# Patient Record
Sex: Female | Born: 1970
Health system: Southern US, Community
[De-identification: ages and names within clinical notes are randomized; demographics above are authoritative.]

## PROBLEM LIST (undated history)

## (undated) DIAGNOSIS — D649 Anemia, unspecified: Secondary | ICD-10-CM

## (undated) DIAGNOSIS — G43909 Migraine, unspecified, not intractable, without status migrainosus: Secondary | ICD-10-CM

## (undated) DIAGNOSIS — K219 Gastro-esophageal reflux disease without esophagitis: Secondary | ICD-10-CM

## (undated) DIAGNOSIS — K5900 Constipation, unspecified: Secondary | ICD-10-CM

## (undated) HISTORY — DX: Gastro-esophageal reflux disease without esophagitis: K21.9

## (undated) HISTORY — DX: Migraine, unspecified, not intractable, without status migrainosus: G43.909

## (undated) HISTORY — DX: Constipation, unspecified: K59.00

## (undated) HISTORY — DX: Anemia, unspecified: D64.9

## (undated) HISTORY — PX: TUBAL LIGATION: SHX77

---

## 2003-07-03 ENCOUNTER — Inpatient Hospital Stay (HOSPITAL_COMMUNITY): Admission: AD | Admit: 2003-07-03 | Discharge: 2003-07-04 | Payer: Self-pay | Admitting: Obstetrics and Gynecology

## 2003-07-25 ENCOUNTER — Other Ambulatory Visit: Admission: RE | Admit: 2003-07-25 | Discharge: 2003-07-25 | Payer: Self-pay | Admitting: Obstetrics and Gynecology

## 2003-07-30 ENCOUNTER — Inpatient Hospital Stay (HOSPITAL_COMMUNITY): Admission: AD | Admit: 2003-07-30 | Discharge: 2003-08-06 | Payer: Self-pay | Admitting: Obstetrics and Gynecology

## 2003-08-09 ENCOUNTER — Inpatient Hospital Stay (HOSPITAL_COMMUNITY): Admission: AD | Admit: 2003-08-09 | Discharge: 2003-08-10 | Payer: Self-pay | Admitting: *Deleted

## 2004-02-15 ENCOUNTER — Inpatient Hospital Stay (HOSPITAL_COMMUNITY): Admission: AD | Admit: 2004-02-15 | Discharge: 2004-02-18 | Payer: Self-pay | Admitting: Obstetrics and Gynecology

## 2004-02-19 ENCOUNTER — Encounter: Admission: RE | Admit: 2004-02-19 | Discharge: 2004-03-20 | Payer: Self-pay | Admitting: Obstetrics and Gynecology

## 2004-03-08 ENCOUNTER — Inpatient Hospital Stay (HOSPITAL_COMMUNITY): Admission: AD | Admit: 2004-03-08 | Discharge: 2004-03-08 | Payer: Self-pay | Admitting: Obstetrics and Gynecology

## 2004-03-26 ENCOUNTER — Other Ambulatory Visit: Admission: RE | Admit: 2004-03-26 | Discharge: 2004-03-26 | Payer: Self-pay | Admitting: Obstetrics and Gynecology

## 2009-05-23 ENCOUNTER — Encounter: Admission: RE | Admit: 2009-05-23 | Discharge: 2009-05-23 | Payer: Self-pay | Admitting: Family Medicine

## 2009-11-03 LAB — HM PAP SMEAR

## 2010-02-15 ENCOUNTER — Ambulatory Visit: Payer: Self-pay | Admitting: Obstetrics and Gynecology

## 2010-02-15 ENCOUNTER — Observation Stay (HOSPITAL_COMMUNITY): Admission: AD | Admit: 2010-02-15 | Discharge: 2010-02-16 | Payer: Self-pay | Admitting: Obstetrics and Gynecology

## 2010-02-20 ENCOUNTER — Ambulatory Visit: Payer: Self-pay | Admitting: Nurse Practitioner

## 2010-02-20 ENCOUNTER — Observation Stay (HOSPITAL_COMMUNITY): Admission: AD | Admit: 2010-02-20 | Discharge: 2010-02-23 | Payer: Self-pay | Admitting: Obstetrics and Gynecology

## 2010-09-24 ENCOUNTER — Inpatient Hospital Stay (HOSPITAL_COMMUNITY): Payer: BC Managed Care – PPO

## 2010-09-24 ENCOUNTER — Encounter (HOSPITAL_COMMUNITY): Payer: Self-pay | Admitting: Obstetrics and Gynecology

## 2010-09-24 ENCOUNTER — Inpatient Hospital Stay (HOSPITAL_COMMUNITY)
Admission: AD | Admit: 2010-09-24 | Discharge: 2010-09-27 | DRG: 373 | Disposition: A | Discharge status: Home / Self Care | Payer: BC Managed Care – PPO | Source: Ambulatory Visit | Attending: Obstetrics and Gynecology | Admitting: Obstetrics and Gynecology

## 2010-09-24 DIAGNOSIS — O9903 Anemia complicating the puerperium: Secondary | ICD-10-CM | POA: Diagnosis not present

## 2010-09-24 DIAGNOSIS — O09529 Supervision of elderly multigravida, unspecified trimester: Secondary | ICD-10-CM | POA: Diagnosis present

## 2010-09-24 DIAGNOSIS — R030 Elevated blood-pressure reading, without diagnosis of hypertension: Secondary | ICD-10-CM | POA: Diagnosis present

## 2010-09-24 DIAGNOSIS — D649 Anemia, unspecified: Secondary | ICD-10-CM | POA: Diagnosis not present

## 2010-09-24 DIAGNOSIS — O99892 Other specified diseases and conditions complicating childbirth: Secondary | ICD-10-CM | POA: Diagnosis present

## 2010-09-24 LAB — COMPREHENSIVE METABOLIC PANEL
Albumin: 2.9 g/dL — ABNORMAL LOW (ref 3.5–5.2)
Alkaline Phosphatase: 151 U/L — ABNORMAL HIGH (ref 39–117)
CO2: 20 mEq/L (ref 19–32)
Calcium: 8.9 mg/dL (ref 8.4–10.5)
Creatinine, Ser: 0.55 mg/dL (ref 0.4–1.2)
GFR calc Af Amer: >60 mL/min (ref >60)
Glucose, Bld: 101 mg/dL — ABNORMAL HIGH (ref 70–99)
Potassium: 4 mEq/L (ref 3.5–5.1)
Total Bilirubin: 0.2 mg/dL — ABNORMAL LOW (ref 0.3–1.2)

## 2010-09-24 LAB — CBC
HCT: 32.3 % — ABNORMAL LOW (ref 36.0–46.0)
MCHC: 31.6 g/dL (ref 30.0–36.0)
MCV: 88.3 fL (ref 78.0–100.0)
RBC: 3.66 MIL/uL — ABNORMAL LOW (ref 3.87–5.11)
RDW: 14.1 % (ref 11.5–15.5)

## 2010-09-25 LAB — RPR: RPR Ser Ql: NONREACTIVE

## 2010-09-26 LAB — CBC
HCT: 24.3 % — ABNORMAL LOW (ref 36.0–46.0)
Hemoglobin: 7.8 g/dL — ABNORMAL LOW (ref 12.0–15.0)
Platelets: 126 10*3/uL — ABNORMAL LOW (ref 150–400)

## 2010-09-27 LAB — CBC
HCT: 23.9 % — ABNORMAL LOW (ref 36.0–46.0)
Hemoglobin: 7.5 g/dL — ABNORMAL LOW (ref 12.0–15.0)
MCH: 27.9 pg (ref 26.0–34.0)
MCHC: 31.4 g/dL (ref 30.0–36.0)
Platelets: 131 10*3/uL — ABNORMAL LOW (ref 150–400)
RBC: 2.69 MIL/uL — ABNORMAL LOW (ref 3.87–5.11)
RDW: 14.5 % (ref 11.5–15.5)
WBC: 6.5 10*3/uL (ref 4.0–10.5)

## 2010-11-08 LAB — COMPREHENSIVE METABOLIC PANEL
AST: 22 U/L (ref 0–37)
Alkaline Phosphatase: 39 U/L (ref 39–117)
BUN: 6 mg/dL (ref 6–23)
Calcium: 9 mg/dL (ref 8.4–10.5)
Chloride: 107 mEq/L (ref 96–112)
Sodium: 135 mEq/L (ref 135–145)
Total Bilirubin: 0.7 mg/dL (ref 0.3–1.2)

## 2010-11-08 LAB — URINALYSIS, ROUTINE W REFLEX MICROSCOPIC
Bilirubin Urine: NEGATIVE
Glucose, UA: NEGATIVE mg/dL
Glucose, UA: NEGATIVE mg/dL
Hgb urine dipstick: NEGATIVE
Ketones, ur: >80 mg/dL — AB
Nitrite: NEGATIVE
Urobilinogen, UA: 1 mg/dL (ref 0.0–1.0)
pH: 6.5 (ref 5.0–8.0)

## 2010-11-08 LAB — CBC
HCT: 40.9 % (ref 36.0–46.0)
MCH: 33.5 pg (ref 26.0–34.0)
MCHC: 34.5 g/dL (ref 30.0–36.0)
MCHC: 35 g/dL (ref 30.0–36.0)
MCV: 95.7 fL (ref 78.0–100.0)
RBC: 4.27 MIL/uL (ref 3.87–5.11)
RBC: 3.75 MIL/uL — ABNORMAL LOW (ref 3.87–5.11)
RDW: 12.3 % (ref 11.5–15.5)
WBC: 8.9 10*3/uL (ref 4.0–10.5)
WBC: 9.2 10*3/uL (ref 4.0–10.5)

## 2010-11-08 LAB — GLUCOSE, CAPILLARY
Glucose-Capillary: 110 mg/dL — ABNORMAL HIGH (ref 70–99)
Glucose-Capillary: 136 mg/dL — ABNORMAL HIGH (ref 70–99)
Glucose-Capillary: 159 mg/dL — ABNORMAL HIGH (ref 70–99)

## 2010-11-08 LAB — BASIC METABOLIC PANEL
GFR calc non Af Amer: >60 mL/min (ref >60)
Potassium: 3.2 mEq/L — ABNORMAL LOW (ref 3.5–5.1)
Sodium: 133 mEq/L — ABNORMAL LOW (ref 135–145)

## 2011-01-08 ENCOUNTER — Other Ambulatory Visit: Payer: Self-pay | Admitting: Obstetrics and Gynecology

## 2011-01-08 ENCOUNTER — Encounter (HOSPITAL_COMMUNITY): Payer: BC Managed Care – PPO

## 2011-01-08 ENCOUNTER — Other Ambulatory Visit (HOSPITAL_COMMUNITY): Payer: BC Managed Care – PPO

## 2011-01-08 LAB — CBC
HCT: 37.9 % (ref 36.0–46.0)
Platelets: 191 10*3/uL (ref 150–400)
RDW: 12.7 % (ref 11.5–15.5)

## 2011-01-08 NOTE — Discharge Summary (Signed)
[name], [name]:  [id number]   MEDICAL RECORD NO.:  [id number]                   PATIENT TYPE:  INP   LOCATION:  9310                                 [facility]ILITY:  [facility]   PHYSICIAN:  Tracie Harrier, M.D.              DATE OF BIRTH:  24-Nov-1970   DATE OF ADMISSION:  07/29/2003  DATE OF DISCHARGE:  08/06/2003                                 DISCHARGE SUMMARY   ADMITTING DIAGNOSES:  1. Intrauterine pregnancy at 11 weeks estimated gestational age.  2. Hyperemesis gravidarum.   DISCHARGE DIAGNOSES:  1. Intrauterine pregnancy at 12 weeks estimated gestational age.  2. Hyperemesis gravidarum managed by Zofran pump.   REASON FOR ADMISSION:  Please see written H&P.   HOSPITAL COURSE:  The patient was a 40 year old Asian female primigravida  that was admitted to [facility] for management of nausea  and vomiting.  The patient had experienced nausea and vomiting for three  days prior to admission.  She had tried oral Zofran without relief.  The  patient was admitted for hydration.  CBC and thyroid function tests were  drawn.  The patient continued to experience nausea and vomiting despite  Tigan suppositories, Medrol dose pack, and Reglan.  The patient was started  on Zofran subcutaneous pump with some improvement.  Due to excessive nausea  and vomiting, potassium levels were down to 2.8.  IV runs of potassium were  given.  Thyroid panel revealed a low TSH of 0.155 with normal free T4.  Endocrine consultation was made and thought was that low TSH was a result of  the acute illness.  The patient continued with nausea, but was able to  tolerate some oral fluids.  Abdomen was soft and uterus was nontender.  The  patient was noted to have history of bulimia.  Consultation was made with  social worker and nutritionalist.  The patient desired discharge.  40 mEq of  K-Dur was given orally prior to discharge and patient was discharged home.   CONDITION ON DISCHARGE:  Stable.   DIET:  Increase fluids as tolerated.  Encouraged protein shakes as  tolerated.   ACTIVITY:  No limitations were given.   FOLLOWUP:  The patient is to follow up in the office in two to three days  for weight check.  She is to call for increase in nausea and vomiting or  dizziness.  She is also to call for vaginal bleeding or increase in vaginal  pressure.   DISCHARGE MEDICATIONS:  Zofran pump managed by [facility].     Julio Sicks, N.P.                        Tracie Harrier, M.D.    CC/MEDQ  D:  08/29/2003  T:  08/29/2003  Job:  [id number]

## 2011-01-08 NOTE — Consult Note (Signed)
NAMEPERLE, BRICKHOUSE NO.:  [id number]   MEDICAL RECORD NO.:  [id number]                   PATIENT TYPE:  INP   LOCATION:  9310                                 [facility]   PHYSICIAN:  Reather Littler, M.D.                    DATE OF BIRTH:  1971-08-17   DATE OF CONSULTATION:  08/05/2003  DATE OF DISCHARGE:                                   CONSULTATION   REASON FOR CONSULTATION:  Thyroid evaluation.   HISTORY:  The patient is a 40 year old primigravida who has been admitted  since July 30, 2003 for hyperemesis and dehydration.  As part of her  routine admission she was found to have a slightly low TSH level.  The  patient, however, has no prior history of thyroid disease.   On questioning, the patient says that she has not had any unusual weight  loss.  She did lose some weight on admission but this was related to her  vomiting and dehydration.  The patient has no history of heat intolerance or  sweating.  She has no history of palpitations or nervousness.  There is no  history of diarrhea, insomnia, or skin rash or itching.  She has not had any  hair or skin changes.  The patient is currently in first trimester of  pregnancy.   CURRENT MEDICATIONS:  Medrol Dosepak, Protonix, Zofran, Tigan, and p.r.n.  medications.   ALLERGIES:  None.   PAST HISTORY:  No significant illness.   FAMILY HISTORY:  No history of thyroid disease or diabetes.   REVIEW OF SYSTEMS:  The patient has no history of diabetes, hypertension, or  GI problems previously.  She has not had any muscle weakness.  She does  complain of some dizziness today with the nausea.   PHYSICAL EXAMINATION:  GENERAL:  The patient is alert and cooperative.  She  is in some distress from her nausea.  VITAL SIGNS:  Her blood pressure is 119/80, pulse is 88 and regular.  HEENT:  There is no pallor.  Eyes externally are normal.  ENT exam normal.  NECK:  There is no thyroid enlargement or  lymphadenopathy.  HEART:  Heart sounds are normal.  LUNGS:  Clear.  ABDOMEN:  No tenderness.  EXTREMITIES:  Normal reflexes or only slightly brisk.  There is no tremor  present.  Hands are not unusually warm or sweaty.   ASSESSMENT:  Since the patient's TSH is not in the hyperthyroid range it may  be simply partially suppressed from acute illness and the euthyroid sick  syndrome.  Free T4 is currently quite normal and therefore I do not suspect  hyperthyroidism.   RECOMMENDATIONS:  The patient should have free T3 level and also repeat TSH  done.   This will be followed up as an outpatient and the patient can be seen in the  office as  needed.  The patient's hypokalemia needs to be treated  intravenously to ensure adequate replacement and repeat levels done in the  morning.   Thank you for the consultation.                                               Reather Littler, M.D.    AK/MEDQ  D:  08/05/2003  T:  08/05/2003  Job:  [id number]   cc:   Dineen Kid. Rana Snare, M.D.  [address]  [place]  [place] [address]  Fax: [phone]

## 2011-01-11 ENCOUNTER — Ambulatory Visit (HOSPITAL_COMMUNITY)
Admission: RE | Admit: 2011-01-11 | Discharge: 2011-01-11 | Disposition: A | Discharge status: Home / Self Care | Payer: BC Managed Care – PPO | Source: Ambulatory Visit | Attending: Obstetrics and Gynecology | Admitting: Obstetrics and Gynecology

## 2011-01-11 DIAGNOSIS — Z01818 Encounter for other preprocedural examination: Secondary | ICD-10-CM | POA: Insufficient documentation

## 2011-01-11 DIAGNOSIS — Z01812 Encounter for preprocedural laboratory examination: Secondary | ICD-10-CM | POA: Insufficient documentation

## 2011-01-11 DIAGNOSIS — Z302 Encounter for sterilization: Secondary | ICD-10-CM | POA: Insufficient documentation

## 2011-01-15 NOTE — Op Note (Signed)
  NAMEKALA, AMBRIZ NO.:  [id number]  MEDICAL RECORD NO.:  [id number]           PATIENT TYPE:  O  LOCATION:  [facility]                          [facility]  PHYSICIAN:  Dalton Mille L. Tamyah Cutbirth, M.D.DATE OF BIRTH:  28-Jul-1971  DATE OF PROCEDURE:  01/11/2011 DATE OF DISCHARGE:                              OPERATIVE REPORT   PREOPERATIVE DIAGNOSIS:  Desires permanent sterilization.  POSTOPERATIVE DIAGNOSIS:  Desires permanent sterilization.  PROCEDURES: 1. Open laparoscopy. 2. Bilateral tubal ligation with fulguration.  SURGEON:  Jaymond Waage L. Vincente Poli, MD  ANESTHESIA:  General.  DESCRIPTION OF PROCEDURE:  The patient was taken to the operating room. She was intubated.  She was then prepped and draped.  In-and-out catheter was used to empty the bladder.  A uterine manipulator was inserted.  Attention was then turned to the abdomen where a small infraumbilical was made with the scalpel.  We then inserted the Veress needle without any difficulty and performed pneumoperitoneum.  I then inserted the 12-mm trocar but I could tell when I put the trocar in that I was preperitoneal.  At this point, I decided to do an open laparoscopy and we easily went in and grasped the fascia using Allis clamps, made a small incision in the fascia and then inserted the Hasson trocar and then inserted the scope.  Pneumoperitoneum was performed quite easily. The scope was inserted.  Looking around, I could see that there was no bowel injury or no bleeding noted anywhere.  I then placed the patient in Trendelenburg position.  We were able to easily examine and visualize the uterus which appeared normal, ovaries appeared normal, as well as the fallopian tube.  Using a long Kleppinger, I then placed across the midportion of each fallopian tube and performed a bilateral tubal ligation with the triple-burn technique, making sure the wattage went down to 0 at each time.  Photographs were taken.   No bleeding was noted. The Hasson was removed after pneumoperitoneum was released.  A stitch was placed in the fascia with 0 Vicryl suture.  The skin was closed with Dermabond.  All sponge, lap, and instrument counts were correct x2.  The patient went to recovery room in good condition.     Paysley Poplar L. Vincente Poli, M.D.     Florestine Avers  D:  01/11/2011  T:  01/12/2011  Job:  [id number]  Electronically Signed by Marcelle Overlie M.D. on 01/15/2011 07:19:25 AM

## 2012-11-15 ENCOUNTER — Encounter: Payer: BC Managed Care – PPO | Admitting: Family Medicine

## 2012-11-22 ENCOUNTER — Encounter: Payer: BC Managed Care – PPO | Admitting: Family Medicine

## 2012-11-23 ENCOUNTER — Other Ambulatory Visit (HOSPITAL_COMMUNITY)
Admission: RE | Admit: 2012-11-23 | Discharge: 2012-11-23 | Disposition: A | Discharge status: Home / Self Care | Payer: BC Managed Care – PPO | Source: Ambulatory Visit | Attending: Family Medicine | Admitting: Family Medicine

## 2012-11-23 ENCOUNTER — Encounter: Payer: Self-pay | Admitting: Family Medicine

## 2012-11-23 ENCOUNTER — Ambulatory Visit (INDEPENDENT_AMBULATORY_CARE_PROVIDER_SITE_OTHER): Payer: BC Managed Care – PPO | Admitting: Family Medicine

## 2012-11-23 VITALS — BP 113/80 | HR 70

## 2012-11-23 DIAGNOSIS — Z1151 Encounter for screening for human papillomavirus (HPV): Secondary | ICD-10-CM | POA: Insufficient documentation

## 2012-11-23 DIAGNOSIS — Z0000 Encounter for general adult medical examination without abnormal findings: Secondary | ICD-10-CM

## 2012-11-23 DIAGNOSIS — Z01419 Encounter for gynecological examination (general) (routine) without abnormal findings: Secondary | ICD-10-CM | POA: Insufficient documentation

## 2012-11-23 DIAGNOSIS — K219 Gastro-esophageal reflux disease without esophagitis: Secondary | ICD-10-CM

## 2012-11-23 DIAGNOSIS — Z124 Encounter for screening for malignant neoplasm of cervix: Secondary | ICD-10-CM

## 2012-11-23 LAB — POCT URINALYSIS DIPSTICK
Bilirubin, UA: NEGATIVE
Blood, UA: NEGATIVE
Glucose, UA: NEGATIVE
Ketones, UA: NEGATIVE
Leukocytes, UA: NEGATIVE
Nitrite, UA: NEGATIVE
Protein, UA: NEGATIVE
Spec Grav, UA: 1.01
Urobilinogen, UA: NEGATIVE
pH, UA: 6

## 2012-11-23 MED ORDER — PANTOPRAZOLE SODIUM 40 MG PO TBEC: 40.0000 mg | DELAYED_RELEASE_TABLET | Freq: Every day | ORAL | Status: DC

## 2012-11-23 NOTE — Progress Notes (Signed)
Subjective:     Patient ID: Katelyn Jenkins, female   DOB: [birth year], 42 y.o.   MRN: [id number]  HPI Katelyn Jenkins is here today for her annual CPE with pap.  She has done well since her last office visit. She had a little girl   Her last LMP was on 11/11/12.   Review of Systems  Constitutional: Negative for activity change, fatigue and unexpected weight change.  HENT: Negative for hearing loss, rhinorrhea, trouble swallowing, neck pain and neck stiffness.   Eyes: Negative for visual disturbance.  Respiratory: Negative for chest tightness, shortness of breath and wheezing.   Cardiovascular: Negative for chest pain and palpitations.  Gastrointestinal: Positive for abdominal pain. Negative for diarrhea and constipation.  Genitourinary: Negative for frequency, vaginal discharge, difficulty urinating and pelvic pain.  Neurological: Negative for dizziness, tremors and weakness.  Psychiatric/Behavioral: Negative for sleep disturbance.       Objective:   Physical Exam  Constitutional: She is oriented to person, place, and time. She appears well-developed and well-nourished.  HENT:  Head: Normocephalic and atraumatic.  Right Ear: External ear normal.  Left Ear: External ear normal.  Nose: Nose normal.  Mouth/Throat: Oropharynx is clear and moist.  Eyes: Conjunctivae and EOM are normal. Pupils are equal, round, and reactive to light.  Neck: Normal range of motion. No thyromegaly present.  Cardiovascular: Normal rate, regular rhythm, normal heart sounds and intact distal pulses.  Exam reveals no gallop and no friction rub.   No murmur heard. Pulmonary/Chest: Effort normal and breath sounds normal.  Abdominal: Soft. Bowel sounds are normal.  Genitourinary: Rectum normal, vagina normal and uterus normal. No breast swelling, tenderness or discharge. Pelvic exam was performed with patient supine. Cervix exhibits no motion tenderness, no discharge and no friability. Right adnexum displays no mass, no  tenderness and no fullness. Left adnexum displays no mass, no tenderness and no fullness. No tenderness around the vagina. No vaginal discharge found.  Musculoskeletal: Normal range of motion. She exhibits no edema and no tenderness.  Lymphadenopathy:    She has no cervical adenopathy.  Neurological: She is alert and oriented to person, place, and time. She has normal reflexes.  Skin: Skin is warm and dry.  Psychiatric: She has a normal mood and affect. Her behavior is normal. Judgment and thought content normal.       Assessment:     CPE  GERD    Plan:     She was given a prescription for Protonix.

## 2012-11-23 NOTE — Patient Instructions (Addendum)
1)  Stomach Pain - Acid Reflux vs Gallstones   A)  Acid Reflux - Try taking Ranitidine 300 mg (2 of the OTC 150 mg) plus Tums plus 1 teaspoon of baking soda in 8 oz water. If this doesn't work then try Nexium.    B)  Gallstones - If you worsen, we could check an abdominal U/S.     2)  Mammogram  Cholelithiasis Cholelithiasis (also called gallstones) is a form of gallbladder disease where gallstones form in your gallbladder. The gallbladder is a non-essential organ that stores bile made in the liver, which helps digest fats. Gallstones begin as small crystals and slowly grow into stones. Gallstone pain occurs when the gallbladder spasms, and a gallstone is blocking the duct. Pain can also occur when a stone passes out of the duct.  Women are more likely to develop gallstones than men. Other factors that increase the risk of gallbladder disease are:  Having multiple pregnancies. Physicians sometimes advise removing diseased gallbladders before future pregnancies.  Obesity.  Diets heavy in fried foods and fat.  Increasing age (older than 47).  Prolonged use of medications containing female hormones.  Diabetes mellitus.  Rapid weight loss.  Family history of gallstones (heredity). SYMPTOMS  Feeling sick to your stomach (nauseous).  Abdominal pain.  Yellowing of the skin (jaundice).  Sudden pain. It may persist from several minutes to several hours.  Worsening pain with deep breathing or when jarred.  Fever.  Tenderness to the touch. In some cases, when gallstones do not move into the bile duct, people have no pain or symptoms. These are called "silent" gallstones. TREATMENT In severe cases, emergency surgery may be required. HOME CARE INSTRUCTIONS   Only take over-the-counter or prescription medicines for pain, discomfort, or fever as directed by your caregiver.  Follow a low-fat diet until seen again. Fat causes the gallbladder to contract, which can result in  pain.  Follow up as instructed. Attacks are almost always recurrent and surgery is usually required for permanent treatment. SEEK IMMEDIATE MEDICAL CARE IF:   Your pain increases and is not controlled by medications.  You have an oral temperature above 102 F (38.9 C), not controlled by medication.  You develop nausea and vomiting. MAKE SURE YOU:   Understand these instructions.  Will watch your condition.  Will get help right away if you are not doing well or get worse. Document Released: 08/05/2005 Document Revised: 11/01/2011 Document Reviewed: 10/08/2010 [facility] Patient Information 2013 [place], [place]. Gastroesophageal Reflux Disease, Adult Gastroesophageal reflux disease (GERD) happens when acid from your stomach flows up into the esophagus. When acid comes in contact with the esophagus, the acid causes soreness (inflammation) in the esophagus. Over time, GERD may create small holes (ulcers) in the lining of the esophagus. CAUSES   Increased body weight. This puts pressure on the stomach, making acid rise from the stomach into the esophagus.  Smoking. This increases acid production in the stomach.  Drinking alcohol. This causes decreased pressure in the lower esophageal sphincter (valve or ring of muscle between the esophagus and stomach), allowing acid from the stomach into the esophagus.  Late evening meals and a full stomach. This increases pressure and acid production in the stomach.  A malformed lower esophageal sphincter. Sometimes, no cause is found. SYMPTOMS   Burning pain in the lower part of the mid-chest behind the breastbone and in the mid-stomach area. This may occur twice a week or more often.  Trouble swallowing.  Sore throat.  Dry  cough.  Asthma-like symptoms including chest tightness, shortness of breath, or wheezing. DIAGNOSIS  Your caregiver may be able to diagnose GERD based on your symptoms. In some cases, X-rays and other tests may be done to  check for complications or to check the condition of your stomach and esophagus. TREATMENT  Your caregiver may recommend over-the-counter or prescription medicines to help decrease acid production. Ask your caregiver before starting or adding any new medicines.  HOME CARE INSTRUCTIONS   Change the factors that you can control. Ask your caregiver for guidance concerning weight loss, quitting smoking, and alcohol consumption.  Avoid foods and drinks that make your symptoms worse, such as:  Caffeine or alcoholic drinks.  Chocolate.  Peppermint or mint flavorings.  Garlic and onions.  Spicy foods.  Citrus fruits, such as oranges, lemons, or limes.  Tomato-based foods such as sauce, chili, salsa, and pizza.  Fried and fatty foods.  Avoid lying down for the 3 hours prior to your bedtime or prior to taking a nap.  Eat small, frequent meals instead of large meals.  Wear loose-fitting clothing. Do not wear anything tight around your waist that causes pressure on your stomach.  Raise the head of your bed 6 to 8 inches with wood blocks to help you sleep. Extra pillows will not help.  Only take over-the-counter or prescription medicines for pain, discomfort, or fever as directed by your caregiver.  Do not take aspirin, ibuprofen, or other nonsteroidal anti-inflammatory drugs (NSAIDs). SEEK IMMEDIATE MEDICAL CARE IF:   You have pain in your arms, neck, jaw, teeth, or back.  Your pain increases or changes in intensity or duration.  You develop nausea, vomiting, or sweating (diaphoresis).  You develop shortness of breath, or you faint.  Your vomit is green, yellow, black, or looks like coffee grounds or blood.  Your stool is red, bloody, or black. These symptoms could be signs of other problems, such as heart disease, gastric bleeding, or esophageal bleeding. MAKE SURE YOU:   Understand these instructions.  Will watch your condition.  Will get help right away if you are not  doing well or get worse. Document Released: 05/19/2005 Document Revised: 11/01/2011 Document Reviewed: 02/26/2011 [facility] Patient Information 2013 [place], [place].

## 2012-11-26 ENCOUNTER — Encounter: Payer: Self-pay | Admitting: Family Medicine

## 2012-11-26 DIAGNOSIS — K219 Gastro-esophageal reflux disease without esophagitis: Secondary | ICD-10-CM | POA: Insufficient documentation

## 2013-04-20 ENCOUNTER — Other Ambulatory Visit: Payer: Self-pay | Admitting: Family Medicine

## 2013-04-24 ENCOUNTER — Other Ambulatory Visit: Payer: Self-pay | Admitting: Family Medicine

## 2013-04-24 DIAGNOSIS — Z1231 Encounter for screening mammogram for malignant neoplasm of breast: Secondary | ICD-10-CM

## 2013-05-16 ENCOUNTER — Ambulatory Visit
Admission: RE | Admit: 2013-05-16 | Discharge: 2013-05-16 | Disposition: A | Discharge status: Home / Self Care | Payer: Self-pay | Source: Ambulatory Visit | Attending: Family Medicine | Admitting: Family Medicine

## 2013-05-16 DIAGNOSIS — Z1231 Encounter for screening mammogram for malignant neoplasm of breast: Secondary | ICD-10-CM

## 2014-06-24 ENCOUNTER — Encounter: Payer: Self-pay | Admitting: Family Medicine

## 2014-10-28 ENCOUNTER — Other Ambulatory Visit: Payer: Self-pay

## 2014-10-28 DIAGNOSIS — Z1231 Encounter for screening mammogram for malignant neoplasm of breast: Secondary | ICD-10-CM

## 2014-10-30 ENCOUNTER — Ambulatory Visit
Admission: RE | Admit: 2014-10-30 | Discharge: 2014-10-30 | Disposition: A | Discharge status: Home / Self Care | Payer: BLUE CROSS/BLUE SHIELD | Source: Ambulatory Visit

## 2014-10-30 DIAGNOSIS — Z1231 Encounter for screening mammogram for malignant neoplasm of breast: Secondary | ICD-10-CM

## 2015-07-27 ENCOUNTER — Emergency Department (HOSPITAL_COMMUNITY)
Admission: EM | Admit: 2015-07-27 | Discharge: 2015-07-27 | Disposition: A | Discharge status: Home / Self Care | Payer: BLUE CROSS/BLUE SHIELD | Attending: Emergency Medicine | Admitting: Emergency Medicine

## 2015-07-27 ENCOUNTER — Emergency Department (HOSPITAL_COMMUNITY): Payer: BLUE CROSS/BLUE SHIELD

## 2015-07-27 ENCOUNTER — Encounter (HOSPITAL_COMMUNITY): Payer: Self-pay | Admitting: Emergency Medicine

## 2015-07-27 DIAGNOSIS — S3991XA Unspecified injury of abdomen, initial encounter: Secondary | ICD-10-CM | POA: Diagnosis present

## 2015-07-27 DIAGNOSIS — Y9389 Activity, other specified: Secondary | ICD-10-CM | POA: Insufficient documentation

## 2015-07-27 DIAGNOSIS — Z8719 Personal history of other diseases of the digestive system: Secondary | ICD-10-CM | POA: Diagnosis not present

## 2015-07-27 DIAGNOSIS — Y998 Other external cause status: Secondary | ICD-10-CM | POA: Insufficient documentation

## 2015-07-27 DIAGNOSIS — Z8679 Personal history of other diseases of the circulatory system: Secondary | ICD-10-CM | POA: Diagnosis not present

## 2015-07-27 DIAGNOSIS — Z9851 Tubal ligation status: Secondary | ICD-10-CM | POA: Insufficient documentation

## 2015-07-27 DIAGNOSIS — Z862 Personal history of diseases of the blood and blood-forming organs and certain disorders involving the immune mechanism: Secondary | ICD-10-CM | POA: Diagnosis not present

## 2015-07-27 DIAGNOSIS — Y92410 Unspecified street and highway as the place of occurrence of the external cause: Secondary | ICD-10-CM | POA: Insufficient documentation

## 2015-07-27 DIAGNOSIS — R1012 Left upper quadrant pain: Secondary | ICD-10-CM

## 2015-07-27 LAB — CBC
HCT: 35.4 % — ABNORMAL LOW (ref 36.0–46.0)
HEMOGLOBIN: 12.1 g/dL (ref 12.0–15.0)
MCH: 30.9 pg (ref 26.0–34.0)
MCHC: 34.2 g/dL (ref 30.0–36.0)
MCV: 90.5 fL (ref 78.0–100.0)
PLATELETS: 196 10*3/uL (ref 150–400)
RBC: 3.91 MIL/uL (ref 3.87–5.11)
RDW: 12.1 % (ref 11.5–15.5)
WBC: 4.5 10*3/uL (ref 4.0–10.5)

## 2015-07-27 LAB — COMPREHENSIVE METABOLIC PANEL
ALBUMIN: 4.4 g/dL (ref 3.5–5.0)
ALK PHOS: 52 U/L (ref 38–126)
ALT: 12 U/L — AB (ref 14–54)
ANION GAP: 7 (ref 5–15)
AST: 24 U/L (ref 15–41)
BILIRUBIN TOTAL: 0.8 mg/dL (ref 0.3–1.2)
BUN: 14 mg/dL (ref 6–20)
CALCIUM: 9.1 mg/dL (ref 8.9–10.3)
CO2: 24 mmol/L (ref 22–32)
CREATININE: 0.53 mg/dL (ref 0.44–1.00)
Chloride: 107 mmol/L (ref 101–111)
GFR calc Af Amer: >60 mL/min (ref >60)
GFR calc non Af Amer: >60 mL/min (ref >60)
GLUCOSE: 105 mg/dL — AB (ref 65–99)
Potassium: 3.6 mmol/L (ref 3.5–5.1)
Sodium: 138 mmol/L (ref 135–145)
TOTAL PROTEIN: 7 g/dL (ref 6.5–8.1)

## 2015-07-27 LAB — LIPASE, BLOOD: LIPASE: 31 U/L (ref 11–51)

## 2015-07-27 LAB — I-STAT BETA HCG BLOOD, ED (MC, WL, AP ONLY)

## 2015-07-27 MED ORDER — IOHEXOL 300 MG/ML  SOLN
100.0000 mL | Freq: Once | INTRAMUSCULAR | Status: AC | PRN
  Administered 2015-07-27: 100 mL via INTRAVENOUS
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
## 2015-07-27 NOTE — ED Notes (Signed)
Patient transported to CT 
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
## 2015-07-27 NOTE — ED Notes (Signed)
2 attempts at IV start

## 2015-10-08 LAB — BASIC METABOLIC PANEL
BUN: 17 mg/dL (ref 4–21)
Creatinine: 0.6 mg/dL (ref 0.5–1.1)
GLUCOSE: 89 mg/dL
POTASSIUM: 4.2 mmol/L (ref 3.4–5.3)
SODIUM: 139 mmol/L (ref 137–147)

## 2015-10-08 LAB — LIPID PANEL
Cholesterol: 174 mg/dL (ref 0–200)
HDL: 45 mg/dL (ref 35–70)
LDL CALC: 92 mg/dL
Triglycerides: 183 mg/dL — AB (ref 40–160)

## 2015-10-08 LAB — TSH: TSH: 0.54 u[IU]/mL (ref 0.41–5.90)

## 2015-10-10 ENCOUNTER — Other Ambulatory Visit: Payer: Self-pay | Admitting: Family Medicine

## 2015-10-10 DIAGNOSIS — Z1231 Encounter for screening mammogram for malignant neoplasm of breast: Secondary | ICD-10-CM

## 2015-10-31 ENCOUNTER — Ambulatory Visit
Admission: RE | Admit: 2015-10-31 | Discharge: 2015-10-31 | Disposition: A | Discharge status: Home / Self Care | Payer: BLUE CROSS/BLUE SHIELD | Source: Ambulatory Visit | Attending: Family Medicine | Admitting: Family Medicine

## 2015-10-31 DIAGNOSIS — Z1231 Encounter for screening mammogram for malignant neoplasm of breast: Secondary | ICD-10-CM

## 2015-11-03 ENCOUNTER — Other Ambulatory Visit: Payer: Self-pay | Admitting: Family Medicine

## 2015-11-03 DIAGNOSIS — R928 Other abnormal and inconclusive findings on diagnostic imaging of breast: Secondary | ICD-10-CM

## 2015-11-11 ENCOUNTER — Ambulatory Visit
Admission: RE | Admit: 2015-11-11 | Discharge: 2015-11-11 | Disposition: A | Discharge status: Home / Self Care | Payer: BLUE CROSS/BLUE SHIELD | Source: Ambulatory Visit | Attending: Family Medicine | Admitting: Family Medicine

## 2015-11-11 DIAGNOSIS — R928 Other abnormal and inconclusive findings on diagnostic imaging of breast: Secondary | ICD-10-CM

## 2016-10-15 ENCOUNTER — Other Ambulatory Visit: Payer: Self-pay | Admitting: Family Medicine

## 2016-10-15 DIAGNOSIS — Z1231 Encounter for screening mammogram for malignant neoplasm of breast: Secondary | ICD-10-CM

## 2016-11-15 ENCOUNTER — Ambulatory Visit: Payer: BLUE CROSS/BLUE SHIELD

## 2016-11-29 ENCOUNTER — Ambulatory Visit
Admission: RE | Admit: 2016-11-29 | Discharge: 2016-11-29 | Disposition: A | Discharge status: Home / Self Care | Payer: BLUE CROSS/BLUE SHIELD | Source: Ambulatory Visit | Attending: Family Medicine | Admitting: Family Medicine

## 2016-11-29 DIAGNOSIS — Z1231 Encounter for screening mammogram for malignant neoplasm of breast: Secondary | ICD-10-CM

## 2016-11-30 ENCOUNTER — Other Ambulatory Visit (HOSPITAL_COMMUNITY)
Admission: RE | Admit: 2016-11-30 | Discharge: 2016-11-30 | Disposition: A | Discharge status: Home / Self Care | Payer: BLUE CROSS/BLUE SHIELD | Source: Ambulatory Visit | Attending: Family Medicine | Admitting: Family Medicine

## 2016-11-30 ENCOUNTER — Ambulatory Visit (INDEPENDENT_AMBULATORY_CARE_PROVIDER_SITE_OTHER): Payer: BLUE CROSS/BLUE SHIELD | Admitting: Family Medicine

## 2016-11-30 ENCOUNTER — Encounter: Payer: Self-pay | Admitting: Family Medicine

## 2016-11-30 VITALS — BP 120/70 | HR 85 | Resp 12 | Ht 60.0 in | Wt 123.0 lb

## 2016-11-30 DIAGNOSIS — Z0000 Encounter for general adult medical examination without abnormal findings: Secondary | ICD-10-CM | POA: Insufficient documentation

## 2016-11-30 DIAGNOSIS — Z131 Encounter for screening for diabetes mellitus: Secondary | ICD-10-CM

## 2016-11-30 DIAGNOSIS — E785 Hyperlipidemia, unspecified: Secondary | ICD-10-CM | POA: Insufficient documentation

## 2016-11-30 DIAGNOSIS — E781 Pure hyperglyceridemia: Secondary | ICD-10-CM

## 2016-11-30 DIAGNOSIS — E559 Vitamin D deficiency, unspecified: Secondary | ICD-10-CM

## 2016-11-30 DIAGNOSIS — Z124 Encounter for screening for malignant neoplasm of cervix: Secondary | ICD-10-CM | POA: Diagnosis present

## 2016-11-30 LAB — BASIC METABOLIC PANEL
BUN: 10 mg/dL (ref 6–23)
CALCIUM: 9.5 mg/dL (ref 8.4–10.5)
CO2: 25 meq/L (ref 19–32)
Chloride: 103 mEq/L (ref 96–112)
Creatinine, Ser: 0.58 mg/dL (ref 0.40–1.20)
GFR: 119.69 mL/min (ref >60.00)
Glucose, Bld: 76 mg/dL (ref 70–99)
Potassium: 3.7 mEq/L (ref 3.5–5.1)
SODIUM: 138 meq/L (ref 135–145)

## 2016-11-30 LAB — LDL CHOLESTEROL, DIRECT: Direct LDL: 89 mg/dL

## 2016-11-30 LAB — LIPID PANEL
Cholesterol: 184 mg/dL (ref 0–200)
HDL: 47.6 mg/dL (ref >39.00)
NONHDL: 136.74
Total CHOL/HDL Ratio: 4
Triglycerides: 223 mg/dL — ABNORMAL HIGH (ref 0.0–149.0)
VLDL: 44.6 mg/dL — ABNORMAL HIGH (ref 0.0–40.0)

## 2016-11-30 LAB — VITAMIN D 25 HYDROXY (VIT D DEFICIENCY, FRACTURES): VITD: 26.92 ng/mL — ABNORMAL LOW (ref 30.00–100.00)

## 2016-11-30 NOTE — Progress Notes (Signed)
Pre visit review using our clinic review tool, if applicable. No additional management support is needed unless otherwise documented below in the visit note. 

## 2016-11-30 NOTE — Progress Notes (Signed)
HPI:   [name] is a 46 y.o. female, who is here today for her routine physical.  I saw her last in 09/2015 for CPE at [facility] Regular exercise 3 or more time per week: Not recently.  Following a healthy diet: Yes. She lives with husband,mother,and 2 children.  Chronic medical problems: Vit D deficiency,she is not on vit D supplementation.Hypertriglyceridemia, GERD.  Last FLP 09/2015 TC 174,TG 183,HDL 45,and LDL 92.  Pap smear last year and negative. Hx of abnormal pap smears: Denies. Hx of STD's Denies.  M: 46 years old. G: 2 L: 2 Birth control: BTL. LMP 11/18/16.  Mammogram: [facility] 1 She had colonoscopy due to constipation 08/2015. She has no concerns today.   Review of Systems  Constitutional: Negative for appetite change, fatigue, fever and unexpected weight change.  HENT: Negative for dental problem, hearing loss, mouth sores, nosebleeds, trouble swallowing and voice change.   Eyes: Negative for photophobia and visual disturbance.  Respiratory: Negative for cough, shortness of breath and wheezing.   Cardiovascular: Negative for chest pain and leg swelling.  Gastrointestinal: Negative for abdominal pain, blood in stool, nausea and vomiting.       No changes in bowel habits.  Endocrine: Negative for cold intolerance, heat intolerance, polydipsia, polyphagia and polyuria.  Genitourinary: Negative for decreased urine volume, dysuria, hematuria, menstrual problem, vaginal bleeding and vaginal discharge.       No breast tenderness or nipple discharge.  Musculoskeletal: Negative for gait problem and joint swelling.  Skin: Negative for rash.  Neurological: Negative for syncope, weakness, numbness and headaches.  Hematological: Negative for adenopathy. Does not bruise/bleed easily.  Psychiatric/Behavioral: Negative for confusion and sleep disturbance. The patient is not nervous/anxious.   All other systems reviewed and are negative.     No current  outpatient prescriptions on file prior to visit.   No current facility-administered medications on file prior to visit.      Past Medical History:  Diagnosis Date  . Anemia   . Constipation   . GERD (gastroesophageal reflux disease)   . Migraine headache     No Known Allergies  Family History  Problem Relation Age of Onset  . Kidney disease Father   . Hypertension Mother   . Cancer Neg Hx     Social History   Social History  . Marital status: Married    Spouse name: N/A  . Number of children: N/A  . Years of education: N/A   Social History Main Topics  . Smoking status: Never Smoker  . Smokeless tobacco: Never Used  . Alcohol use No  . Drug use: No  . Sexual activity: Yes    Birth control/ protection: Surgical   Other Topics Concern  . None   Social History Narrative   Marital Status:  Married    Children:  G1 P1001 (Son - Joelene Millin)    Pets:  None    Living Situation: Lives with husband.   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 O2 sat at RA 99%.  Wt Readings from Last 3 Encounters:  11/30/16 123 lb (55.8 kg)    Physical Exam  Nursing note and vitals reviewed. Constitutional: She is oriented to person, place, and time. She appears well-developed and well-nourished. No distress.  HENT:  Head: Atraumatic.  Right Ear: Hearing, tympanic membrane, external ear and ear canal normal.  Left Ear: Hearing, tympanic membrane, external ear and ear canal normal.  Mouth/Throat: Uvula is midline, oropharynx is clear and moist and mucous membranes are normal.  Eyes: Conjunctivae and EOM are normal. Pupils are equal, round, and reactive to light.  Neck:  No tracheal deviation present. No thyroid mass and no thyromegaly present.  Cardiovascular: Normal rate and regular rhythm.   No murmur heard. Pulses:      Dorsalis pedis pulses are 2+ on the right side, and 2+ on the left side.  Respiratory: Effort normal and breath sounds normal. No respiratory distress.  GI: Soft. She exhibits no mass. There is no hepatomegaly. There is no tenderness. Hernia confirmed negative in the right inguinal area and confirmed negative in the left inguinal area.  Genitourinary: No breast swelling or tenderness. There is no rash, tenderness or lesion on the right labia. There is no rash, tenderness or lesion on the left labia. Uterus is not enlarged and not tender. Cervix exhibits no motion tenderness, no discharge and no friability. Right adnexum displays no mass and no tenderness. Left adnexum displays no mass and no tenderness. No erythema or tenderness in the vagina. No vaginal discharge found.  Genitourinary Comments: Breast: No masses or nipple discharge. Fibrocystic changes outer upper quadrants bilateral.   Musculoskeletal: She exhibits no edema.  No major deformity or signs of synovitis appreciated.  Lymphadenopathy:    She has no cervical adenopathy.    She has no axillary adenopathy.       Right: No inguinal and no supraclavicular adenopathy present.       Left: No inguinal and no supraclavicular adenopathy present.  Neurological: She is alert and oriented to person, place, and time. She has normal strength. No cranial nerve deficit. Coordination and gait normal.  Reflex Scores:      Bicep reflexes are 2+ on the right side and 2+ on the left side.      Patellar reflexes are 2+ on the right side and 2+ on the left side. Skin: Skin is warm. No rash noted. No erythema.  Psychiatric: She has a normal mood and affect. Her speech is normal. Cognition and memory are normal.  Well groomed, good eye contact.    ASSESSMENT AND PLAN:   Katelyn Jenkins was seen today for  annual exam.  Diagnoses and all orders for this visit:  Lab Results  Component Value Date   CHOL 184 11/30/2016   HDL 47.60 11/30/2016   LDLCALC 92 10/08/2015   LDLDIRECT 89.0 11/30/2016   TRIG 223.0 (H) 11/30/2016   CHOLHDL 4 11/30/2016   Lab Results  Component Value Date   CREATININE 0.58 11/30/2016   BUN 10 11/30/2016   NA 138 11/30/2016   K 3.7 11/30/2016   CL 103 11/30/2016   CO2 25 11/30/2016    Routine general medical examination at a health care facility   We discussed the importance of regular physical activity and healthy diet for prevention of chronic illness and/or complications. Preventive guidelines reviewed. Vaccination up to date per pt request, Tdap not sure about date.  Ca++ and vit D supplementation recommended. Next CPE in 1  year.   -     Basic metabolic panel -     Lipid panel -     PAP [[facility]] -     LDL cholesterol, direct  Vitamin D deficiency  Further recommendations will be given according to lab results.  -     VITAMIN D 25 Hydroxy (Vit-D Deficiency, Fractures)  Hypertriglyceridemia  Low fat diet recommended as well as regular exercise. Further recommendations will be given according to lab results.  -     Lipid panel -     LDL cholesterol, direct  Diabetes mellitus screening -     Basic metabolic panel  Cervical cancer screening -     PAP [[place]]    Return in about 1 year (around 11/30/2017) for routine.      Katelyn G. [place], MD  [facility]. [place] office.

## 2016-11-30 NOTE — Patient Instructions (Signed)
A few things to remember from today's visit:   Routine general medical examination at a health care facility - Plan: Basic metabolic panel, Lipid panel, Cytology - PAP ([place])  Vitamin D deficiency - Plan: VITAMIN D 25 Hydroxy (Vit-D Deficiency, Fractures)    At least 150 minutes of moderate exercise per week, daily brisk walking for 15-30 min is a good exercise option. Healthy diet low in saturated (animal) fats and sweets and consisting of fresh fruits and vegetables, lean meats such as fish and white chicken and whole grains.   - Vaccines:  Tdap vaccine every 10 years.  Shingles vaccine recommended at age 32, could be given after 45 years of age but not sure about insurance coverage.  Pneumonia vaccines:  Prevnar 13 at 65 and Pneumovax at 66.  Screening recommendations for low/normal risk women:  Screening for diabetes at age 28-45 and every 3 years.  Cervical cancer prevention:  -HPV vaccination between 61-71 years old. -Pap smear starts at 45 years of age and continues periodically until 45 years old in low risk women. Pap smear every 3 years between 49 and 79 years old. Pap smear every 3 years between women 30 and older if pap smear negative and HPV screening negative.   -Breast cancer: Mammogram: There is disagreement between experts about when to start screening in low risk asymptomatic female but recent recommendations are to start screening at 15 and not later than 45 years old , every 1-2 years and after 45 yo q 2 years. Screening is recommended until 45 years old but some women can continue screening depending of healthy issues.   Colon cancer screening: starts at 45 years old until 45 years old.  Cholesterol disorder screening at age 92 and every 3 years.  Also recommended:  1. Dental visit- Brush and floss your teeth twice daily; visit your dentist twice a year. 2. Eye doctor- Get an eye exam at least every 2 years. 3. Helmet use- Always wear a helmet when  riding a bicycle, motorcycle, rollerblading or skateboarding. 4. Safe sex- If you may be exposed to sexually transmitted infections, use a condom. 5. Seat belts- Seat belts can save your live; always wear one. 6. Smoke/Carbon Monoxide detectors- These detectors need to be installed on the appropriate level of your home. Replace batteries at least once a year. 7. Skin cancer- When out in the sun please cover up and use sunscreen 15 SPF or higher. 8. Violence- If anyone is threatening or hurting you, please tell your healthcare provider.  9. Drink alcohol in moderation- Limit alcohol intake to one drink or less per day. Never drink and drive.  Please be sure medication list is accurate. If a new problem present, please set up appointment sooner than planned today.

## 2016-12-02 ENCOUNTER — Encounter: Payer: Self-pay | Admitting: Family Medicine

## 2016-12-02 DIAGNOSIS — K589 Irritable bowel syndrome without diarrhea: Secondary | ICD-10-CM | POA: Insufficient documentation

## 2016-12-02 LAB — CYTOLOGY - PAP
DIAGNOSIS: NEGATIVE
HPV (WINDOPATH): NOT DETECTED

## 2017-01-24 ENCOUNTER — Telehealth: Payer: Self-pay | Admitting: Family Medicine

## 2017-01-24 NOTE — Telephone Encounter (Signed)
Pt traveling to [place] 7[place]23 [place]. Would like malaria and another med that she said starts with a pinn... Unsure what she wants and she is not either. Sorry. Thanks.

## 2017-01-24 NOTE — Telephone Encounter (Signed)
Pt states her son's [facility] Pt will call back if that doesn't work out.

## 2017-01-24 NOTE — Telephone Encounter (Signed)
She is going to need vaccines before she leaves, so can we just have her come in for an appointment? That way we can make sure she's covered.  Thank you!

## 2017-01-24 NOTE — Telephone Encounter (Signed)
If she is travelling for [place] no every traveler needs Malaria prophylaxis, it depends of the area she is going to visit and mosquito bite prevention is usually the only preventive recommendation for visitors.  Mosquito bite prevention and safe supply of water and food are also important for other type of diseases. Yellow fever also depending of area she is visiting.  She should be up to date with Tdap and MMR. Recommended Typhoid vaccine and hepatitis A are recommended on all travelers.  Typhoid vaccine can be oral or injected (travellers clinics). Oral vaccine is recommended 1 cap daily x 4 doses, needs to complete it at least a week before exposure.  If she is going to an area with malaria risk, medication for prevention depends of which type.Usually I recommend Malarone 1 tab daily starting 1-2 days before exposure until 7 days after exposure.  In general if she may need to go to a travel clinic when they are more familiar with specific recommendations and can get vaccines in office.  Thanks, BJ

## 2017-01-24 NOTE — Telephone Encounter (Signed)
Please advise 

## 2017-03-23 ENCOUNTER — Ambulatory Visit (INDEPENDENT_AMBULATORY_CARE_PROVIDER_SITE_OTHER): Payer: BLUE CROSS/BLUE SHIELD | Admitting: Family Medicine

## 2017-03-23 ENCOUNTER — Encounter: Payer: Self-pay | Admitting: Family Medicine

## 2017-03-23 VITALS — BP 118/80 | HR 93 | Temp 98.2°F | Resp 12 | Ht 60.0 in | Wt 109.0 lb

## 2017-03-23 DIAGNOSIS — R059 Cough, unspecified: Secondary | ICD-10-CM

## 2017-03-23 DIAGNOSIS — K219 Gastro-esophageal reflux disease without esophagitis: Secondary | ICD-10-CM | POA: Diagnosis not present

## 2017-03-23 DIAGNOSIS — R05 Cough: Secondary | ICD-10-CM | POA: Diagnosis not present

## 2017-03-23 MED ORDER — BENZONATATE 100 MG PO CAPS: 200.0000 mg | ORAL_CAPSULE | Freq: Two times a day (BID) | ORAL | 0 refills | Status: AC | PRN

## 2017-03-23 MED ORDER — RANITIDINE HCL 150 MG PO CAPS: 150.0000 mg | ORAL_CAPSULE | Freq: Two times a day (BID) | ORAL | 0 refills | Status: DC

## 2017-03-23 NOTE — Progress Notes (Signed)
HPI:  ACUTE VISIT  Chief Complaint  Patient presents with  . Cough    [name] is a 46 y.o.female here today complaining of 5 days of cough, not coughing up sputum but she feels like she needs to cough something up.  Recently came back from [place], 03/17/17, symptoms started a day before. She attributes symptoms to exposure to heavy rain and taking a cold shower while she was in [place].  Retrosternal discomfort, constant, no radiated. Hx of GERD, she has had heartburn. She has not identified exacerbating or alleviating factors. Denies abdominal pain, nausea, vomiting, changes in bowel habits, blood in stool or melena.  She denies changes in appetite or dysphagia.  Cough  This is a new problem. The current episode started in the past 7 days. The problem has been unchanged. The problem occurs constantly. The cough is non-productive. Associated symptoms include heartburn and a rash. Pertinent negatives include no ear congestion, fever, headaches, hemoptysis, myalgias, nasal congestion, postnasal drip, rhinorrhea, sore throat, shortness of breath, sweats, weight loss or wheezing. Nothing aggravates the symptoms. Risk factors for lung disease include travel. Treatments tried: Amoxacillin. The treatment provided no relief. There is no history of asthma or environmental allergies.   She took a few tabs of Amoxicillin given by her priest.  No sick contact. No known insect bite.  No Hx of allergies.  Symptoms otherwise stable.   She also mentions having a pruritic, erythematous rash that started a week after receiving vaccinations required before her trip (Hep A,Tdap,and Typhoid).  She already followed with a dermatologist and Prednisone course was given. She still has some intermittent pruritic rash but in general he has improved. She is also taking OTC Zyrtec 10 mg as needed and topical steroid bid as needed.   Review of Systems  Constitutional: Negative for  activity change, appetite change, fatigue, fever, unexpected weight change and weight loss.  HENT: Positive for congestion. Negative for mouth sores, postnasal drip, rhinorrhea, sinus pressure, sneezing, sore throat, trouble swallowing and voice change.   Respiratory: Positive for cough. Negative for hemoptysis, shortness of breath and wheezing.   Cardiovascular: Negative for palpitations and leg swelling.  Gastrointestinal: Positive for heartburn. Negative for abdominal pain, diarrhea, nausea and vomiting.  Musculoskeletal: Negative for arthralgias, joint swelling, myalgias and neck pain.  Skin: Positive for rash. Negative for wound.  Allergic/Immunologic: Negative for environmental allergies.  Neurological: Negative for syncope, weakness and headaches.  Hematological: Negative for adenopathy. Does not bruise/bleed easily.      No current outpatient prescriptions on file prior to visit.   No current facility-administered medications on file prior to visit.      Past Medical History:  Diagnosis Date  . Anemia   . Constipation   . GERD (gastroesophageal reflux disease)   . Migraine headache    No Known Allergies  Social History   Social History  . Marital status: Married    Spouse name: N/A  . Number of children: N/A  . Years of education: N/A   Social History Main Topics  . Smoking status: Never Smoker  . Smokeless tobacco: Never Used  . Alcohol use No  . Drug use: No  . Sexual activity: Yes    Birth control/ protection: Surgical   Other Topics Concern  . None   Social History Narrative   Marital Status:  Married    Children:  G1 P1001 (Son - Joelene MillinOliver)    Pets:  None  Living Situation: Lives with husband.   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   Physical Exam  Nursing note and vitals reviewed. Constitutional: She is oriented to person, place, and time. She appears well-developed and well-nourished. She does not appear ill. No distress.  HENT:  Head: Atraumatic.  Mouth/Throat: Oropharynx is clear and moist and mucous membranes are normal.  Eyes: Conjunctivae are normal.  Neck: No muscular tenderness present. No edema and no erythema present.  Cardiovascular: Normal rate and regular rhythm.   No murmur heard. Respiratory: Effort normal and breath sounds normal. No respiratory distress.  Musculoskeletal: She exhibits no edema or tenderness.  Lymphadenopathy:       Head (right side): No submandibular adenopathy present.       Head (left side): No submandibular adenopathy present.    She has no cervical adenopathy.       Right: No supraclavicular adenopathy present.       Left: No supraclavicular adenopathy present.  Neurological: She is alert and oriented to person, place, and time. She has normal strength.  Skin: Skin is warm. Rash noted. No ecchymosis and no petechiae noted. Rash is maculopapular. Rash is not vesicular. No erythema.  Right LE, lateral to knee with erythematous rash she is scratching during her OV.  Psychiatric: She has a normal mood and affect. Her speech is normal.  Well groomed, good eye contact.     ASSESSMENT AND PLAN:   [name] was seen today for cough.  Diagnoses and all orders for this visit:  Cough  We discussed possible etiologies: URI, allergies, and GI among some. She decided not to have imaging done today. She was instructed to let me know in about 2-3 weeks if cough is not any better, in which case CXR will be arranged. Instructed about warning signs. Follow-up as needed.  -     Cancel: DG Chest 2 View; Future -     benzonatate (TESSALON)  100 MG capsule; Take 2 capsules (200 mg total) by mouth 2 (two) times daily as needed.  Gastroesophageal reflux disease, esophagitis presence not specified  This problem can certainly contribute to her cough. GERD precautions discussed. Ranitidine 150 mg twice daily recommended for 30 days, some side effects discussed. Follow-up as needed.  -     ranitidine (ZANTAC) 150 MG capsule; Take 1 capsule (150 mg total) by mouth 2 (two) times daily.   In regard to pruritic rash, Ranitidine might also help. Continue Zyrtec 10 mg daily and topical steroid recommended by dermatologist. Follow-up with dermatologist if needed.    -Ms. Katelyn Jenkins was advised to seek attention immediately if symptoms worsen or to follow if they persist or new concerns arise.       Gene Colee G. [place]az[place], MD  [facility]. [place] office.

## 2017-03-23 NOTE — Patient Instructions (Addendum)
A few things to remember from today's visit:   Cough - Plan: CANCELED: DG Chest 2 View  Gastroesophageal reflux disease, esophagitis presence not specified - Plan: ranitidine (ZANTAC) 150 MG capsule  ? Viral illness, GERD,alleries. I do not think antibiotic is needed.    Avoid foods that make your symptoms worse, for example coffee, chocolate,pepermeint,alcohol, and greasy food. Raising the head of your bed about 6 inches may help with nocturnal symptoms.  Avoid tobacco use. Weight loss (if you are overweight). Avoid lying down for 3 hours after eating.  Instead 3 large meals daily try small and more frequent meals during the day.   You should be evaluated immediately if bloody vomiting, bloody stools, black stools (like tar), difficulty swallowing, food gets stuck on the way down or choking when eating. Abnormal weight loss or severe abdominal pain.  Please let me know in 3 weeks if he is still coughing. We may need to do X ray then.   Please be sure medication list is accurate. If a new problem present, please set up appointment sooner than planned today.

## 2017-05-11 ENCOUNTER — Ambulatory Visit: Payer: BLUE CROSS/BLUE SHIELD | Admitting: Family Medicine

## 2017-05-12 ENCOUNTER — Encounter: Payer: Self-pay | Admitting: Family Medicine

## 2017-10-18 NOTE — Progress Notes (Signed)
HPI:   Ms.Zia Belfiore is a 47 y.o. female, who is here today for her routine physical.  Last CPE: 11/2016.  Regular exercise 3 or more time per week: Yes, daily running. Following a healthy diet: Yes, trying to eat healthier. She lives with her husband, her mother, and 2 children.  Chronic medical problems: GERD, hypertriglyceridemia, IBS, and vitamin D deficiency among some.  Pap smear 2018 negative Pap smear and HPV. Hx of abnormal pap smears: Denies Hx of STD's denies Menarche at age 24. G: 2 L: 2 S/P BTL   LMP: 09/23/17  There is no immunization history on file for this patient.  Mammogram: BI-RADS 1 in 11/2016.  Concerns/follow up today.   Hyperlipidemia:  Currently on nonpharmacologic treatment. Following a low fat diet: Yes.    Lab Results  Component Value Date   CHOL 184 11/30/2016   HDL 47.60 11/30/2016   LDLCALC 92 10/08/2015   LDLDIRECT 89.0 11/30/2016   TRIG 223.0 (H) 11/30/2016   CHOLHDL 4 11/30/2016   Vitamin D deficiency: Currently she is on OTC vitamin D 2000 units daily, she forgets to take it frequently.  Vitamin D OH in 11/2016 was low at 26.9  Retrosternal burning sensation when lying down at night for the past few days. Hx of GERD, she takes Zantac as needed. + Heartburn Denies abdominal pain, nausea, vomiting, changes in bowel habits, blood in stool or melena. She has not identified exacerbating or alleviating factors.  She has no symptoms with exertion.   Review of Systems  Constitutional: Negative for appetite change, fatigue and fever.  HENT: Negative for dental problem, hearing loss, mouth sores, sore throat, trouble swallowing and voice change.   Eyes: Negative for redness and visual disturbance.  Respiratory: Negative for cough, shortness of breath and wheezing.   Cardiovascular: Negative for chest pain and leg swelling.  Gastrointestinal: Negative for abdominal pain, nausea and vomiting.       No changes in bowel  habits.  Endocrine: Negative for cold intolerance, heat intolerance, polydipsia, polyphagia and polyuria.  Genitourinary: Negative for decreased urine volume, dysuria, hematuria, vaginal bleeding and vaginal discharge.  Musculoskeletal: Negative for gait problem and myalgias.  Skin: Negative for color change and rash.  Allergic/Immunologic: Negative for environmental allergies.  Neurological: Negative for syncope, weakness and headaches.  Hematological: Negative for adenopathy. Does not bruise/bleed easily.  Psychiatric/Behavioral: Negative for confusion and sleep disturbance. The patient is not nervous/anxious.   All other systems reviewed and are negative.     Current Outpatient Medications on File Prior to Visit  Medication Sig Dispense Refill  . ranitidine (ZANTAC) 150 MG capsule Take 1 capsule (150 mg total) by mouth 2 (two) times daily. 60 capsule 0   No current facility-administered medications on file prior to visit.      Past Medical History:  Diagnosis Date  . Anemia   . Constipation   . GERD (gastroesophageal reflux disease)   . Migraine headache     Past Surgical History:  Procedure Laterality Date  . TUBAL LIGATION      No Known Allergies  Family History  Problem Relation Age of Onset  . Kidney disease Father   . Hypertension Mother   . Cancer Neg Hx     Social History   Socioeconomic History  . Marital status: Married    Spouse name: None  . Number of children: None  . Years of education: None  . Highest education level: None  Social Needs  .  Financial resource strain: None  . Food insecurity - worry: None  . Food insecurity - inability: None  . Transportation needs - medical: None  . Transportation needs - non-medical: None  Occupational History  . None  Tobacco Use  . Smoking status: Never Smoker  . Smokeless tobacco: Never Used  Substance and Sexual Activity  . Alcohol use: No  . Drug use: No  . Sexual activity: Yes    Birth  control/protection: Surgical  Other Topics Concern  . None  Social History Narrative   Marital Status:  Married    Children:  G1 P1001 (Son - Joelene Millin)    Pets:  None    Living Situation: Lives with husband.   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   Wt Readings from Last 3 Encounters:  10/19/17 111 lb (50.3 kg)  03/23/17 109 lb (49.4 kg)  11/30/16 123 lb (55.8 kg)    Physical Exam  Nursing note and vitals reviewed. Constitutional: She is oriented to person, place, and time. She appears well-developed and well-nourished. No distress.  HENT:  Head: Normocephalic and atraumatic.  Right Ear: Hearing, tympanic membrane, external ear and ear canal normal.  Left Ear: Hearing, tympanic membrane, external ear and ear canal normal.  Mouth/Throat: Uvula is midline, oropharynx is clear and moist and mucous membranes are normal.  Eyes: Conjunctivae and EOM are normal. Pupils are equal, round, and reactive to light.  Neck: No tracheal deviation present. No thyroid mass and no thyromegaly present.  Cardiovascular: Normal rate and regular rhythm.  No murmur heard. Pulses:      Dorsalis pedis pulses are 2+ on the right side, and 2+ on the left side.  Respiratory: Effort normal and breath sounds normal. No respiratory distress.  GI: Soft. She exhibits no mass. There is no hepatomegaly. There is no tenderness.  Genitourinary:  Genitourinary Comments: Breast: Fibrocystic changes in upper outer quadrant bilaterally.  No nipple discharge or skin abnormalities appreciated.  Musculoskeletal: She exhibits no edema or tenderness.  No major deformity or  signs of synovitis appreciated.  Lymphadenopathy:    She has no cervical adenopathy.    She has no axillary adenopathy.       Right: No supraclavicular adenopathy present.       Left: No supraclavicular adenopathy present.  Neurological: She is alert and oriented to person, place, and time. She has normal strength. No cranial nerve deficit. Coordination and gait normal.  Reflex Scores:      Bicep reflexes are 2+ on the right side and 2+ on the left side.      Patellar reflexes are 2+ on the right side and 2+ on the left side. Skin: Skin is warm. No rash noted. No erythema.  Psychiatric: Her mood appears anxious. Cognition and memory are normal.  Well groomed, good eye contact.     ASSESSMENT AND PLAN:  Ms. Mamta Rimmer was here today annual physical examination.   Orders Placed This Encounter  Procedures  . Basic metabolic panel  . Lipid panel  . VITAMIN D 25 Hydroxy (Vit-D Deficiency, Fractures)   Lab Results  Component Value Date   CHOL 160 10/19/2017   HDL 52.20 10/19/2017   LDLCALC 90 10/19/2017   LDLDIRECT 89.0 11/30/2016   TRIG 92.0 10/19/2017   CHOLHDL 3 10/19/2017   Lab Results  Component Value Date   CREATININE 0.67 10/19/2017   BUN 10 10/19/2017   NA 139 10/19/2017   K 4.0 10/19/2017   CL 105 10/19/2017   CO2 29 10/19/2017    Routine general medical examination at a health care facility  We discussed the importance of regular physical activity and healthy diet for prevention of chronic illness and/or complications. Preventive guidelines reviewed. She was instructed to arrange mammogram for 01/2018. Vaccination up to date. Ca++ and vit D supplementation recommended. Next CPE in a year.  The 10-year ASCVD risk score Denman George(Goff DC Montez HagemanJr., et al., 2013) is: 0.5%   Values used to calculate the score:     Age: [age] years     Sex: Female     Is Non-Hispanic African American: No     Diabetic: No     Tobacco smoker: No     Systolic Blood Pressure: 110 mmHg     Is  BP treated: No     HDL Cholesterol: 52.2 mg/dL     Total Cholesterol: 160 mg/dL  Diabetes mellitus screening -     Basic metabolic panel   Vitamin D deficiency Further recommendations will be given according to lab results. Recommend trying to take vitamin D 2000 units daily or 10,000 units weekly.   GERD (gastroesophageal reflux disease) Omeprazole 20 mg daily recommended for 3-4 weeks, then she can take as needed if asymptomatic. GERD precautions discussed. If symptoms are persistent she was instructed to let me know, in which case we will consider increasing dose of Omeprazole or changing to a different PPI. Instructed about warning signs.  Hypertriglyceridemia Continue nonpharmacologic treatment. Further recommendations will be given according to lab results. Follow-up in 6-12 months.    Return in 1 year (on 10/19/2018) for CPE.      Tashaun Obey G. [place], MD  [facility]. [place] office.

## 2017-10-19 ENCOUNTER — Encounter: Payer: Self-pay | Admitting: Family Medicine

## 2017-10-19 ENCOUNTER — Ambulatory Visit (INDEPENDENT_AMBULATORY_CARE_PROVIDER_SITE_OTHER): Payer: Commercial Managed Care - PPO | Admitting: Family Medicine

## 2017-10-19 VITALS — BP 110/66 | HR 68 | Temp 98.0°F | Resp 12 | Ht 60.0 in | Wt 111.0 lb

## 2017-10-19 DIAGNOSIS — Z0000 Encounter for general adult medical examination without abnormal findings: Secondary | ICD-10-CM

## 2017-10-19 DIAGNOSIS — Z131 Encounter for screening for diabetes mellitus: Secondary | ICD-10-CM | POA: Diagnosis not present

## 2017-10-19 DIAGNOSIS — E781 Pure hyperglyceridemia: Secondary | ICD-10-CM | POA: Diagnosis not present

## 2017-10-19 DIAGNOSIS — K219 Gastro-esophageal reflux disease without esophagitis: Secondary | ICD-10-CM

## 2017-10-19 DIAGNOSIS — E559 Vitamin D deficiency, unspecified: Secondary | ICD-10-CM

## 2017-10-19 LAB — BASIC METABOLIC PANEL
BUN: 10 mg/dL (ref 6–23)
CALCIUM: 9.3 mg/dL (ref 8.4–10.5)
CO2: 29 meq/L (ref 19–32)
Chloride: 105 mEq/L (ref 96–112)
Creatinine, Ser: 0.67 mg/dL (ref 0.40–1.20)
GFR: 100.93 mL/min (ref >60.00)
GLUCOSE: 79 mg/dL (ref 70–99)
Potassium: 4 mEq/L (ref 3.5–5.1)
SODIUM: 139 meq/L (ref 135–145)

## 2017-10-19 LAB — LIPID PANEL
Cholesterol: 160 mg/dL (ref 0–200)
HDL: 52.2 mg/dL (ref >39.00)
LDL Cholesterol: 90 mg/dL (ref 0–99)
NONHDL: 108.06
Total CHOL/HDL Ratio: 3
Triglycerides: 92 mg/dL (ref 0.0–149.0)
VLDL: 18.4 mg/dL (ref 0.0–40.0)

## 2017-10-19 LAB — VITAMIN D 25 HYDROXY (VIT D DEFICIENCY, FRACTURES): VITD: 30.91 ng/mL (ref 30.00–100.00)

## 2017-10-19 MED ORDER — OMEPRAZOLE 20 MG PO CPDR: 20.0000 mg | DELAYED_RELEASE_CAPSULE | Freq: Every day | ORAL | 3 refills | Status: DC

## 2017-10-19 NOTE — Patient Instructions (Addendum)
A few things to remember from today's visit:   Routine general medical examination at a health care facility  Diabetes mellitus screening - Plan: Basic metabolic panel  Hypertriglyceridemia - Plan: Lipid panel  Vitamin D deficiency - Plan: VITAMIN D 25 Hydroxy (Vit-D Deficiency, Fractures)  Gastroesophageal reflux disease, esophagitis presence not specified  Today you have you routine preventive visit.  At least 150 minutes of moderate exercise per week, daily brisk walking for 15-30 min is a good exercise option. Healthy diet low in saturated (animal) fats and sweets and consisting of fresh fruits and vegetables, lean meats such as fish and white chicken and whole grains.  These are some of recommendations for screening depending of age and risk factors:   - Vaccines:  Tdap vaccine every 10 years.  Shingles vaccine recommended at age 46, could be given after 46 years of age but not sure about insurance coverage.   Pneumonia vaccines:  Prevnar 13 at 65 and Pneumovax at 66. Sometimes Pneumovax is giving earlier if history of smoking, lung disease,diabetes,kidney disease among some.    Screening for diabetes at age 46 and every 3 years.  Cervical cancer prevention:  Pap smear starts at 46 years of age and continues periodically until 46 years old in low risk women. Pap smear every 3 years between 7521 and 46 years old. Pap smear every 3-5 years between women 30 and older if pap smear negative and HPV screening negative.   -Breast cancer: Mammogram: There is disagreement between experts about when to start screening in low risk asymptomatic female but recent recommendations are to start screening at 3240 and not later than 46 years old , every 1-2 years and after 46 yo q 2 years. Screening is recommended until 46 years old but some women can continue screening depending of healthy issues.   Colon cancer screening: starts at 46 years old until 46 years old.  Cholesterol disorder  screening at age 46 and every 3 years.  Also recommended:  1. Dental visit- Brush and floss your teeth twice daily; visit your dentist twice a year. 2. Eye doctor- Get an eye exam at least every 2 years. 3. Helmet use- Always wear a helmet when riding a bicycle, motorcycle, rollerblading or skateboarding. 4. Safe sex- If you may be exposed to sexually transmitted infections, use a condom. 5. Seat belts- Seat belts can save your live; always wear one. 6. Smoke/Carbon Monoxide detectors- These detectors need to be installed on the appropriate level of your home. Replace batteries at least once a year. 7. Skin cancer- When out in the sun please cover up and use sunscreen 15 SPF or higher. 8. Violence- If anyone is threatening or hurting you, please tell your healthcare provider.  9. Drink alcohol in moderation- Limit alcohol intake to one drink or less per day. Never drink and drive.  Please be sure medication list is accurate. If a new problem present, please set up appointment sooner than planned today.

## 2017-10-19 NOTE — Assessment & Plan Note (Signed)
Further recommendations will be given according to lab results. Recommend trying to take vitamin D 2000 units daily or 10,000 units weekly.

## 2017-10-19 NOTE — Assessment & Plan Note (Signed)
Omeprazole 20 mg daily recommended for 3-4 weeks, then she can take as needed if asymptomatic. GERD precautions discussed. If symptoms are persistent she was instructed to let me know, in which case we will consider increasing dose of Omeprazole or changing to a different PPI. Instructed about warning signs.

## 2017-10-19 NOTE — Assessment & Plan Note (Signed)
Continue nonpharmacologic treatment. Further recommendations will be given according to lab results. Follow-up in 6-12 months.

## 2017-12-28 ENCOUNTER — Other Ambulatory Visit: Payer: Self-pay | Admitting: Family Medicine

## 2017-12-28 DIAGNOSIS — Z1231 Encounter for screening mammogram for malignant neoplasm of breast: Secondary | ICD-10-CM

## 2018-01-20 ENCOUNTER — Ambulatory Visit
Admission: RE | Admit: 2018-01-20 | Discharge: 2018-01-20 | Disposition: A | Discharge status: Home / Self Care | Payer: Commercial Managed Care - PPO | Source: Ambulatory Visit | Attending: Family Medicine | Admitting: Family Medicine

## 2018-01-20 DIAGNOSIS — Z1231 Encounter for screening mammogram for malignant neoplasm of breast: Secondary | ICD-10-CM

## 2018-03-21 ENCOUNTER — Ambulatory Visit: Payer: Commercial Managed Care - PPO | Admitting: Family Medicine

## 2018-12-25 ENCOUNTER — Encounter: Payer: Self-pay | Admitting: Family Medicine

## 2018-12-25 ENCOUNTER — Ambulatory Visit (INDEPENDENT_AMBULATORY_CARE_PROVIDER_SITE_OTHER): Payer: Commercial Managed Care - PPO | Admitting: Family Medicine

## 2018-12-25 ENCOUNTER — Other Ambulatory Visit: Payer: Self-pay

## 2018-12-25 VITALS — Resp 12

## 2018-12-25 DIAGNOSIS — L298 Other pruritus: Secondary | ICD-10-CM | POA: Diagnosis not present

## 2018-12-25 DIAGNOSIS — R519 Headache, unspecified: Secondary | ICD-10-CM

## 2018-12-25 DIAGNOSIS — R51 Headache: Secondary | ICD-10-CM

## 2018-12-25 MED ORDER — TRIAMCINOLONE ACETONIDE 0.1 % EX CREA: 1.0000 "application " | TOPICAL_CREAM | Freq: Two times a day (BID) | CUTANEOUS | 0 refills | Status: DC

## 2018-12-25 NOTE — Progress Notes (Signed)
Virtual Visit via Video Note   I connected with Katelyn Jenkins on 12/25/18 at  3:15 PM EDT by a video enabled telemedicine application and verified that I am speaking with the correct person using two identifiers.  Location patient: home Location provider:work home office Persons participating in the virtual visit: patient, provider  I discussed the limitations of evaluation and management by telemedicine and the availability of in person appointments.She expressed understanding and agreed to proceed.   HPI: Katelyn Jenkins is a 48 yo female who is c/o left retro-ocular pressure/throbbing headache that started on 12/23/2018 evening, sudden onset. Pain was 9/10.  She denies history of similar headaches. States that occasionally she is aching a headache when she stops drinking coffee, which she has not at this time.  Associated nausea and vomiting, she had about 5-6 episodes of vomiting.  Negative for chills, fever, visual changes, photophobia, nasal congestion, rhinorrhea, numbness, tingling, or focal deficit. Sunday a.m. she started feeling better. She took acetaminophen and NyQuil, the latter one to help her sleep.  She still has "little" pressure sensation retro-ocular left side. Nausea and vomiting have resolved.   She is also complaining about pruritic rash lateral aspects of neck  and wrist. She has noted similar rash intermittently since 05/2018, gradually getting worse.  She has had eczema in the past, has followed with dermatologist. No associated arthralgias or joint edema, no oral lesions.  She tried OTC cortisone, did not help much. Negative for new medication, new detergent, new soap, or any outdoor exposure.   ROS: See pertinent positives and negatives per HPI.  Past Medical History:  Diagnosis Date  . Anemia   . Constipation   . GERD (gastroesophageal reflux disease)   . Migraine headache     Past Surgical History:  Procedure Laterality Date  . TUBAL LIGATION       Family History  Problem Relation Age of Onset  . Kidney disease Father   . Hypertension Mother   . Cancer Neg Hx     Social History   Socioeconomic History  . Marital status: Married    Spouse name: Not on file  . Number of children: Not on file  . Years of education: Not on file  . Highest education level: Not on file  Occupational History  . Not on file  Social Needs  . Financial resource strain: Not on file  . Food insecurity:    Worry: Not on file    Inability: Not on file  . Transportation needs:    Medical: Not on file    Non-medical: Not on file  Tobacco Use  . Smoking status: Never Smoker  . Smokeless tobacco: Never Used  Substance and Sexual Activity  . Alcohol use: No  . Drug use: No  . Sexual activity: Yes    Birth control/protection: Surgical  Lifestyle  . Physical activity:    Days per week: Not on file    Minutes per session: Not on file  . Stress: Not on file  Relationships  . Social connections:    Talks on phone: Not on file    Gets together: Not on file    Attends religious service: Not on file    Active member of club or organization: Not on file    Attends meetings of clubs or organizations: Not on file    Relationship status: Not on file  . Intimate partner violence:    Fear of current or ex partner: Not on file  Emotionally abused: Not on file    Physically abused: Not on file    Forced sexual activity: Not on file  Other Topics Concern  . Not on file  Social History Narrative   Marital Status:  Married    Children:  G1 P1001 (Son - Joelene MillinOliver)    Pets:  None    Living Situation: Lives with husband.   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  omeprazole (PRILOSEC) 20 MG capsule,  Take 1 capsule (20 mg total) by mouth daily before breakfast., Disp: 30 capsule, Rfl: 3 .  ranitidine (ZANTAC) 150 MG capsule, Take 1 capsule (150 mg total) by mouth 2 (two) times daily., Disp: 60 capsule, Rfl: 0 .  triamcinolone cream (KENALOG) 0.1 %, Apply 1 application topically 2 (two) times daily., Disp: 30 g, Rfl: 0  EXAM:  VITALS per patient if applicable:Resp 12   GENERAL: alert, oriented, appears well and in no acute distress  HEENT: atraumatic, normocephalic, conjunttiva clear and intact EOM bilateral.  No obvious facial abnormalities on inspection.   NECK: normal movements of the head and neck  LUNGS: on inspection no signs of respiratory distress, breathing rate appears normal, no obvious gross SOB, gasping or wheezing  CV: no obvious cyanosis  Katelyn: moves all visible extremities without noticeable abnormality  SKIN: I do not appreciate erythematous rash through video.  PSYCH/NEURO: pleasant and cooperative, no obvious depression, she is anxious, speech and thought processing grossly intact.  No focal deficit.  ASSESSMENT AND PLAN:  Discussed the following assessment and plan:  Acute nonintractable headache, unspecified headache type - Plan: MR Brain Wo Contrast Improved but still having mild headache. ?  Migraine. Because no prior history of similar headaches(severe + several episodes of vomiting), I am recommending brain MRI. She was clearly instructed about warning signs and she voices understanding.  Pruritic erythematous rash - Plan: triamcinolone cream (KENALOG) 0.1 % Possible causes discussed. ?  Eczema. Recommend triamcinolone cream on affected area, small amount, twice daily to 14 days.  We discussed possible side effects of chronic topical steroid use. If problem does not resolve or gets worse, recommend arranging appointment with her dermatologist.   I discussed the assessment and treatment plan with the patient.  She was provided an opportunity to ask  questions and all were answered.  She agreed with the plan and demonstrated an understanding of the instructions.   The patient was advised to call back or seek an in-person evaluation if the symptoms worsen or if the condition fails to improve as anticipated.  Return if symptoms worsen or fail to improve.    Toniya Rozar [place], MD

## 2019-01-04 ENCOUNTER — Other Ambulatory Visit: Payer: Self-pay

## 2019-01-04 ENCOUNTER — Other Ambulatory Visit: Payer: Self-pay | Admitting: Family Medicine

## 2019-01-04 ENCOUNTER — Ambulatory Visit
Admission: RE | Admit: 2019-01-04 | Discharge: 2019-01-04 | Disposition: A | Discharge status: Home / Self Care | Payer: Commercial Managed Care - PPO | Source: Ambulatory Visit | Attending: Family Medicine | Admitting: Family Medicine

## 2019-01-04 DIAGNOSIS — R519 Headache, unspecified: Secondary | ICD-10-CM

## 2019-01-05 ENCOUNTER — Encounter: Payer: Self-pay | Admitting: Family Medicine

## 2019-01-22 ENCOUNTER — Other Ambulatory Visit: Payer: Self-pay | Admitting: Family Medicine

## 2019-01-22 DIAGNOSIS — Z1231 Encounter for screening mammogram for malignant neoplasm of breast: Secondary | ICD-10-CM

## 2019-03-19 ENCOUNTER — Other Ambulatory Visit: Payer: Self-pay

## 2019-03-19 ENCOUNTER — Ambulatory Visit
Admission: RE | Admit: 2019-03-19 | Discharge: 2019-03-19 | Disposition: A | Discharge status: Home / Self Care | Payer: Commercial Managed Care - PPO | Source: Ambulatory Visit | Attending: Family Medicine | Admitting: Family Medicine

## 2019-03-19 DIAGNOSIS — Z1231 Encounter for screening mammogram for malignant neoplasm of breast: Secondary | ICD-10-CM

## 2020-02-18 ENCOUNTER — Encounter: Payer: Self-pay | Admitting: Family Medicine

## 2020-02-18 ENCOUNTER — Ambulatory Visit (INDEPENDENT_AMBULATORY_CARE_PROVIDER_SITE_OTHER): Payer: Managed Care, Other (non HMO) | Admitting: Family Medicine

## 2020-02-18 ENCOUNTER — Other Ambulatory Visit: Payer: Self-pay

## 2020-02-18 VITALS — BP 120/70 | HR 94 | Temp 98.0°F | Resp 12 | Ht 60.0 in | Wt 124.1 lb

## 2020-02-18 DIAGNOSIS — Z1329 Encounter for screening for other suspected endocrine disorder: Secondary | ICD-10-CM | POA: Diagnosis not present

## 2020-02-18 DIAGNOSIS — Z130 Encounter for screening for diseases of the blood and blood-forming organs and certain disorders involving the immune mechanism: Secondary | ICD-10-CM

## 2020-02-18 DIAGNOSIS — E559 Vitamin D deficiency, unspecified: Secondary | ICD-10-CM | POA: Diagnosis not present

## 2020-02-18 DIAGNOSIS — Z0000 Encounter for general adult medical examination without abnormal findings: Secondary | ICD-10-CM | POA: Diagnosis not present

## 2020-02-18 DIAGNOSIS — Z1159 Encounter for screening for other viral diseases: Secondary | ICD-10-CM | POA: Diagnosis not present

## 2020-02-18 DIAGNOSIS — E781 Pure hyperglyceridemia: Secondary | ICD-10-CM

## 2020-02-18 DIAGNOSIS — R202 Paresthesia of skin: Secondary | ICD-10-CM | POA: Diagnosis not present

## 2020-02-18 DIAGNOSIS — M545 Low back pain, unspecified: Secondary | ICD-10-CM

## 2020-02-18 DIAGNOSIS — Z13228 Encounter for screening for other metabolic disorders: Secondary | ICD-10-CM | POA: Diagnosis not present

## 2020-02-18 DIAGNOSIS — G8929 Other chronic pain: Secondary | ICD-10-CM

## 2020-02-18 LAB — CBC
HCT: 35.6 % — ABNORMAL LOW (ref 36.0–46.0)
Hemoglobin: 12.4 g/dL (ref 12.0–15.0)
MCHC: 34.8 g/dL (ref 30.0–36.0)
MCV: 91.8 fl (ref 78.0–100.0)
Platelets: 160 10*3/uL (ref 150.0–400.0)
RBC: 3.88 Mil/uL (ref 3.87–5.11)
RDW: 12.2 % (ref 11.5–15.5)
WBC: 4.2 10*3/uL (ref 4.0–10.5)

## 2020-02-18 LAB — LIPID PANEL
Cholesterol: 153 mg/dL (ref 0–200)
HDL: 39.4 mg/dL (ref >39.00)
LDL Cholesterol: 76 mg/dL (ref 0–99)
NonHDL: 113.16
Total CHOL/HDL Ratio: 4
Triglycerides: 184 mg/dL — ABNORMAL HIGH (ref 0.0–149.0)
VLDL: 36.8 mg/dL (ref 0.0–40.0)

## 2020-02-18 LAB — COMPREHENSIVE METABOLIC PANEL
ALT: 12 U/L (ref 0–35)
AST: 15 U/L (ref 0–37)
Albumin: 4.1 g/dL (ref 3.5–5.2)
Alkaline Phosphatase: 57 U/L (ref 39–117)
BUN: 15 mg/dL (ref 6–23)
CO2: 24 mEq/L (ref 19–32)
Calcium: 8.8 mg/dL (ref 8.4–10.5)
Chloride: 106 mEq/L (ref 96–112)
Creatinine, Ser: 0.54 mg/dL (ref 0.40–1.20)
GFR: 120.57 mL/min (ref >60.00)
Glucose, Bld: 94 mg/dL (ref 70–99)
Potassium: 3.6 mEq/L (ref 3.5–5.1)
Sodium: 137 mEq/L (ref 135–145)
Total Bilirubin: 0.3 mg/dL (ref 0.2–1.2)
Total Protein: 6.1 g/dL (ref 6.0–8.3)

## 2020-02-18 LAB — VITAMIN D 25 HYDROXY (VIT D DEFICIENCY, FRACTURES): VITD: 20.21 ng/mL — ABNORMAL LOW (ref 30.00–100.00)

## 2020-02-18 LAB — VITAMIN B12: Vitamin B-12: 231 pg/mL (ref 211–911)

## 2020-02-18 LAB — HEMOGLOBIN A1C: Hgb A1c MFr Bld: 4.9 % (ref 4.6–6.5)

## 2020-02-18 LAB — TSH: TSH: 0.91 u[IU]/mL (ref 0.35–4.50)

## 2020-02-18 NOTE — Patient Instructions (Addendum)
Today you have you routine preventive visit. A few things to remember from today's visit:  Hypertriglyceridemia - Plan: Lipid panel  Vitamin D deficiency - Plan: VITAMIN D 25 Hydroxy (Vit-D Deficiency, Fractures)  Encounter for HCV screening test for low risk patient - Plan: Hepatitis C antibody  Tingling in extremities - Plan: TSH, Vitamin B12, CBC  Routine general medical examination at a health care facility  Screening for endocrine, metabolic and immunity disorder - Plan: Comprehensive metabolic panel, Hemoglobin A1c  Chronic bilateral low back pain without sciatica  Back pain could be arthritis, it does not seem to be worrisome. If tingling gets worse I need to know, for now I think we can monitor.  At least 150 minutes of moderate exercise per week, daily brisk walking for 15-30 min is a good exercise option. Healthy diet low in saturated (animal) fats and sweets and consisting of fresh fruits and vegetables, lean meats such as fish and white chicken and whole grains.  These are some of recommendations for screening depending of age and risk factors:  - Vaccines:  Tdap vaccine every 10 years.  Shingles vaccine recommended at age 51, could be given after 48 years of age but not sure about insurance coverage.   Pneumonia vaccines: Pneumovax at 65. Sometimes Pneumovax is giving earlier if history of smoking, lung disease,diabetes,kidney disease among some.  Screening for diabetes at age 44 and every 3 years.  Cervical cancer prevention:  Pap smear starts at 48 years of age and continues periodically until 48 years old in low risk women. Pap smear every 3 years between 52 and 72 years old. Pap smear every 3-5 years between women 30 and older if pap smear negative and HPV screening negative.   -Breast cancer: Mammogram: There is disagreement between experts about when to start screening in low risk asymptomatic female but recent recommendations are to start screening at 64  and not later than 48 years old , every 1-2 years and after 48 yo q 2 years. Screening is recommended until 48 years old but some women can continue screening depending of healthy issues.  Colon cancer screening: Has been recently changed to 48 yo. Insurance may not cover until you are 48 years old. Screening is recommended until 48 years old.  Cholesterol disorder screening at age 76 and every 3 years.  Also recommended:  1. Dental visit- Brush and floss your teeth twice daily; visit your dentist twice a year. 2. Eye doctor- Get an eye exam at least every 2 years. 3. Helmet use- Always wear a helmet when riding a bicycle, motorcycle, rollerblading or skateboarding. 4. Safe sex- If you may be exposed to sexually transmitted infections, use a condom. 5. Seat belts- Seat belts can save your live; always wear one. 6. Smoke/Carbon Monoxide detectors- These detectors need to be installed on the appropriate level of your home. Replace batteries at least once a year. 7. Skin cancer- When out in the sun please cover up and use sunscreen 15 SPF or higher. 8. Violence- If anyone is threatening or hurting you, please tell your healthcare provider.  9. Drink alcohol in moderation- Limit alcohol intake to one drink or less per day. Never drink and drive. 10. Calcium supplementation 1000 to 1200 mg daily, ideally through your diet.  Vitamin D supplementation 800 units daily.

## 2020-02-18 NOTE — Progress Notes (Signed)
HPI: Katelyn Jenkins is a 49 y.o. female, who is here today for her routine physical.  Last CPE: 10/19/17  Regular exercise 3 or more time per week: She has not been consistent. Running was causing post ankle pain. Following a healthy diet: "Trying" She lives with husband and 2 children.  Chronic medical problems: Vit D def,hyperTG,a nd GERD among some.  Pap smear 2018 negative Pap smear and HPV. Hx of abnormal pap smears: Denies Hx of STD's denies Menarche at age 67. G: 2 L: 2 S/P BTL  There is no immunization history on file for this patient.  Mammogram: 03/19/19 Colonoscopy: Never. We discussed current recommendations in regard to colon cancer screening.  DEXA: N/A  Hep C screening: Never.  She has some concerns today.  Today she is c/o hand and feet tingling sensation for about a year. Intermittent non radiated neck mild pain. Stable. She does not take OTC medication.  Lower back , bilateral, worse in the morning when first gets up and better when starts moving. It has been going up for a while. Stable.  No radiated but occasionally she feels mild LE's numbness.  Negative for saddle anesthesia or bladder/bowel dysfunction. No hx of trauma.  Vit D deficiency: She is not on Vit D supplementation.  HLD: She is on non pharmacologic treatment.  Component     Latest Ref Rng & Units 10/19/2017  Cholesterol     0 - 200 mg/dL 389  Triglycerides     0 - 149 mg/dL 37.3  HDL Cholesterol     >39.00 mg/dL 42.87  VLDL     0.0 - 68.1 mg/dL 15.7  LDL (calc)     0 - 99 mg/dL 90  Total CHOL/HDL Ratio      3  NonHDL      108.06   Review of Systems  Constitutional: Negative for appetite change, fatigue and fever.  HENT: Negative for dental problem, hearing loss, mouth sores and sore throat.   Eyes: Negative for redness and visual disturbance.  Respiratory: Negative for cough, shortness of breath and wheezing.   Cardiovascular: Negative for chest pain and leg  swelling.  Gastrointestinal: Negative for abdominal pain, nausea and vomiting.       No changes in bowel habits.  Endocrine: Negative for cold intolerance, heat intolerance, polydipsia, polyphagia and polyuria.  Genitourinary: Negative for decreased urine volume, dysuria, hematuria, vaginal bleeding and vaginal discharge.  Musculoskeletal: Negative for gait problem and myalgias.  Skin: Negative for color change and rash.  Allergic/Immunologic: Negative for environmental allergies.  Neurological: Negative for syncope, weakness and headaches.  Hematological: Negative for adenopathy. Does not bruise/bleed easily.  Psychiatric/Behavioral: Negative for confusion and sleep disturbance.  All other systems reviewed and are negative.  Current Outpatient Medications on File Prior to Visit  Medication Sig Dispense Refill   omeprazole (PRILOSEC) 20 MG capsule Take 1 capsule (20 mg total) by mouth daily before breakfast. 30 capsule 3   ranitidine (ZANTAC) 150 MG capsule Take 1 capsule (150 mg total) by mouth 2 (two) times daily. 60 capsule 0   triamcinolone cream (KENALOG) 0.1 % Apply 1 application topically 2 (two) times daily. 30 g 0   No current facility-administered medications on file prior to visit.   Past Medical History:  Diagnosis Date   Anemia    Constipation    GERD (gastroesophageal reflux disease)    Migraine headache    Past Surgical History:  Procedure Laterality Date   TUBAL LIGATION  No Known Allergies  Family History  Problem Relation Age of Onset   Kidney disease Father    Hypertension Mother    Cancer Neg Hx     Social History   Socioeconomic History   Marital status: Married    Spouse name: Not on file   Number of children: Not on file   Years of education: Not on file   Highest education level: Not on file  Occupational History   Not on file  Tobacco Use   Smoking status: Never Smoker   Smokeless tobacco: Never Used  Substance and  Sexual Activity   Alcohol use: No   Drug use: No   Sexual activity: Yes    Birth control/protection: Surgical  Other Topics Concern   Not on file  Social History Narrative   Marital Status:  Married    Children:  G1 P1001 (Son - Manufacturing systems engineer)    Pets:  None    Living Situation: Lives with husband.   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[employer])       Education:  Manufacturing engineer (Information Systems):  [employer]    Tobacco Use/Exposure:  None    Alcohol Use:  None    Drug Use:  None   Diet:  Regular   Exercise:  Limited    Hobbies:  Reading          Social Determinants of Corporate investment banker Strain:    Difficulty of Paying Living Expenses:   Food Insecurity:    Worried About Programme researcher, broadcasting/film/video in the Last Year:    Barista in the Last Year:   Transportation Needs:    Freight forwarder (Medical):    Lack of Transportation (Non-Medical):   Physical Activity:    Days of Exercise per Week:    Minutes of Exercise per Session:   Stress:    Feeling of Stress :   Social Connections:    Frequency of Communication with Friends and Family:    Frequency of Social Gatherings with Friends and Family:    Attends Religious Services:    Active Member of Clubs or Organizations:    Attends Banker Meetings:    Marital Status:    Vitals:   02/18/20 2122  BP: 120/70  Pulse: 94  Resp: 12  Temp: 98 F (36.7 C)  SpO2: 100%   Body mass index is 24.24 kg/m.  Wt Readings from Last 3 Encounters:  02/18/20 124 lb 2 oz (56.3 kg)  10/19/17 111 lb (50.3 kg)  03/23/17 109 lb (49.4 kg)   Physical Exam Vitals and nursing note reviewed.  Constitutional:      General: She is not in acute distress.    Appearance: She is well-developed and normal weight.  HENT:     Head: Normocephalic and atraumatic.     Right Ear: Hearing, tympanic membrane, ear canal and external ear normal.     Left Ear: Hearing, tympanic membrane, ear canal and external  ear normal.     Mouth/Throat:     Pharynx: Uvula midline.  Eyes:     Conjunctiva/sclera: Conjunctivae normal.     Pupils: Pupils are equal, round, and reactive to light.  Neck:     Thyroid: No thyromegaly.     Trachea: No tracheal deviation.  Cardiovascular:     Rate and Rhythm: Normal rate and regular rhythm.     Pulses:          Dorsalis pedis pulses are  2+ on the right side and 2+ on the left side.     Heart sounds: No murmur heard.   Pulmonary:     Effort: Pulmonary effort is normal. No respiratory distress.     Breath sounds: Normal breath sounds.  Abdominal:     Palpations: Abdomen is soft. There is no hepatomegaly or mass.     Tenderness: There is no abdominal tenderness.  Genitourinary:    Comments: Breast" No masses,nipple discharge, or skin changes. Nodularity upper outer quadrant, bilateral. Musculoskeletal:     Cervical back: No edema, deformity or tenderness. Normal range of motion.     Lumbar back: No spasms or tenderness. Normal range of motion. Negative right straight leg raise test and negative left straight leg raise test.     Right lower leg: No edema.     Left lower leg: No edema.     Comments: No major deformity or signs of synovitis appreciated. Phalen and Tinel negative bilateral.  Lymphadenopathy:     Cervical: No cervical adenopathy.     Upper Body:     Right upper body: No supraclavicular adenopathy.     Left upper body: No supraclavicular adenopathy.  Skin:    General: Skin is warm.     Findings: No erythema or rash.  Neurological:     General: No focal deficit present.     Mental Status: She is alert and oriented to person, place, and time.     Cranial Nerves: No cranial nerve deficit.     Motor: Motor function is intact.     Coordination: Coordination normal.     Gait: Gait normal.     Deep Tendon Reflexes:     Reflex Scores:      Bicep reflexes are 2+ on the right side and 2+ on the left side.      Patellar reflexes are 2+ on the right  side and 2+ on the left side. Psychiatric:        Mood and Affect: Mood and affect normal.        Speech: Speech normal.        Cognition and Memory: Cognition normal.     Comments: Well groomed, good eye contact.    ASSESSMENT AND PLAN:  Katelyn Jenkins was here today annual physical examination.  Orders Placed This Encounter  Procedures   Hepatitis C antibody   Lipid panel   VITAMIN D 25 Hydroxy (Vit-D Deficiency, Fractures)   Comprehensive metabolic panel   TSH   Hemoglobin A1c   Vitamin B12   CBC   Lab Results  Component Value Date   CHOL 153 02/18/2020   HDL 39.40 02/18/2020   LDLCALC 76 02/18/2020   LDLDIRECT 89.0 11/30/2016   TRIG 184.0 (H) 02/18/2020   CHOLHDL 4 02/18/2020    Lab Results  Component Value Date   WBC 4.2 02/18/2020   HGB 12.4 02/18/2020   HCT 35.6 (L) 02/18/2020   MCV 91.8 02/18/2020   PLT 160.0 02/18/2020   Lab Results  Component Value Date   TSH 0.91 02/18/2020   Lab Results  Component Value Date   HGBA1C 4.9 02/18/2020   Lab Results  Component Value Date   VITAMINB12 231 02/18/2020   Lab Results  Component Value Date   ALT 12 02/18/2020   AST 15 02/18/2020   ALKPHOS 57 02/18/2020   BILITOT 0.3 02/18/2020   Routine general medical examination at a health care facility We discussed the importance of regular physical activity  and healthy diet for prevention of chronic illness and/or complications. Preventive guidelines reviewed. Vaccination up to date. New colon cancer recommendations discussed. She was instructed to call her insurance to ask of colon cancer screening is cover, she prefers to hold on GI referral. Next CPE in a year.  The 10-year ASCVD risk score Mikey Bussing DC Brooke Bonito., et al., 2013) is: 0.9%   Values used to calculate the score:     Age: 74 years     Sex: Female     Is Non-Hispanic African American: No     Diabetic: No     Tobacco smoker: No     Systolic Blood Pressure: 193 mmHg     Is BP treated: No      HDL Cholesterol: 39.4 mg/dL     Total Cholesterol: 153 mg/dL  Encounter for HCV screening test for low risk patient -     Hepatitis C antibody  Tingling in extremities We discussed possible etiologies. Neuro exam negative. Instructed about warning signs. Further recommendations according to lab results.  Screening for endocrine, metabolic and immunity disorder -     Comprehensive metabolic panel -     Hemoglobin A1c  Chronic bilateral low back pain without sciatica Hx and examination do not suggest a serious process. I do not think imaging is needed at this time. ? OA.  Hypertriglyceridemia Continue non pharmacologic treatment. Further recommendations according to FLP results and 10 year CVD risk.  Vitamin D deficiency Further recommendations according to 29 OH vit D result.   Return in 1 year (on 02/17/2021) for cpe.  Jeanmarc Viernes G. Martinique, MD  [facility]. [place] office.   Today you have you routine preventive visit. A few things to remember from today's visit:  Hypertriglyceridemia - Plan: Lipid panel  Vitamin D deficiency - Plan: VITAMIN D 25 Hydroxy (Vit-D Deficiency, Fractures)  Encounter for HCV screening test for low risk patient - Plan: Hepatitis C antibody  Tingling in extremities - Plan: TSH, Vitamin B12, CBC  Routine general medical examination at a health care facility  Screening for endocrine, metabolic and immunity disorder - Plan: Comprehensive metabolic panel, Hemoglobin A1c  Chronic bilateral low back pain without sciatica  Back pain could be arthritis, it does not seem to be worrisome. If tingling gets worse I need to know, for now I think we can monitor.  At least 150 minutes of moderate exercise per week, daily brisk walking for 15-30 min is a good exercise option. Healthy diet low in saturated (animal) fats and sweets and consisting of fresh fruits and vegetables, lean meats such as fish and white chicken and whole grains.  These  are some of recommendations for screening depending of age and risk factors:  - Vaccines:  Tdap vaccine every 10 years.  Shingles vaccine recommended at age 13, could be given after 48 years of age but not sure about insurance coverage.   Pneumonia vaccines: Pneumovax at 87. Sometimes Pneumovax is giving earlier if history of smoking, lung disease,diabetes,kidney disease among some.  Screening for diabetes at age 23 and every 3 years.  Cervical cancer prevention:  Pap smear starts at 48 years of age and continues periodically until 48 years old in low risk women. Pap smear every 3 years between 13 and 63 years old. Pap smear every 3-5 years between women 57 and older if pap smear negative and HPV screening negative.   -Breast cancer: Mammogram: There is disagreement between experts about when to start screening in low risk  asymptomatic female but recent recommendations are to start screening at 6240 and not later than 48 years old , every 1-2 years and after 48 yo q 2 years. Screening is recommended until 48 years old but some women can continue screening depending of healthy issues.  Colon cancer screening: Has been recently changed to 48 yo. Insurance may not cover until you are 48 years old. Screening is recommended until 48 years old.  Cholesterol disorder screening at age 48 and every 3 years.  Also recommended:  1. Dental visit- Brush and floss your teeth twice daily; visit your dentist twice a year. 2. Eye doctor- Get an eye exam at least every 2 years. 3. Helmet use- Always wear a helmet when riding a bicycle, motorcycle, rollerblading or skateboarding. 4. Safe sex- If you may be exposed to sexually transmitted infections, use a condom. 5. Seat belts- Seat belts can save your live; always wear one. 6. Smoke/Carbon Monoxide detectors- These detectors need to be installed on the appropriate level of your home. Replace batteries at least once a year. 7. Skin cancer- When out in the  sun please cover up and use sunscreen 15 SPF or higher. 8. Violence- If anyone is threatening or hurting you, please tell your healthcare provider.  9. Drink alcohol in moderation- Limit alcohol intake to one drink or less per day. Never drink and drive. 10. Calcium supplementation 1000 to 1200 mg daily, ideally through your diet.  Vitamin D supplementation 800 units daily.    Colonoscopy is the ideal, if normal it can be repeated every 10 years.  Immunochemical test (FIT): Hemoccult cards, very sensitive if all 3 cards are done. If negative it can be repeated annually. If positive a colonoscopy should follow.   Cologuard: Intended for patients with average risk factors for colon cancer from 3750-48 years old. Anyone with prior history of colon polyps or family history of colonic neoplasia is NOT average risk. May be repeated every 3 years.  It is not as sensitive in older patients. Cost is around  $ 649. If positive a diagnostic colonoscopy must be done.

## 2020-02-18 NOTE — Assessment & Plan Note (Addendum)
Further recommendations according to 25 OH vit D result. 

## 2020-02-18 NOTE — Assessment & Plan Note (Signed)
Continue non pharmacologic treatment. Further recommendations according to FLP results and 10 year CVD risk.

## 2020-02-19 LAB — HEPATITIS C ANTIBODY
Hepatitis C Ab: NONREACTIVE
SIGNAL TO CUT-OFF: 0.14 (ref <1.00)

## 2020-03-27 ENCOUNTER — Other Ambulatory Visit: Payer: Self-pay | Admitting: Family Medicine

## 2020-03-27 DIAGNOSIS — Z1231 Encounter for screening mammogram for malignant neoplasm of breast: Secondary | ICD-10-CM

## 2020-04-14 ENCOUNTER — Other Ambulatory Visit: Payer: Self-pay

## 2020-04-14 ENCOUNTER — Ambulatory Visit
Admission: RE | Admit: 2020-04-14 | Discharge: 2020-04-14 | Disposition: A | Discharge status: Home / Self Care | Payer: Managed Care, Other (non HMO) | Source: Ambulatory Visit | Attending: Family Medicine | Admitting: Family Medicine

## 2020-04-14 DIAGNOSIS — Z1231 Encounter for screening mammogram for malignant neoplasm of breast: Secondary | ICD-10-CM

## 2020-04-15 ENCOUNTER — Other Ambulatory Visit: Payer: Self-pay | Admitting: Family Medicine

## 2020-04-15 DIAGNOSIS — N63 Unspecified lump in unspecified breast: Secondary | ICD-10-CM

## 2020-04-24 ENCOUNTER — Other Ambulatory Visit: Payer: Self-pay

## 2020-04-24 ENCOUNTER — Ambulatory Visit
Admission: RE | Admit: 2020-04-24 | Discharge: 2020-04-24 | Disposition: A | Discharge status: Home / Self Care | Payer: Managed Care, Other (non HMO) | Source: Ambulatory Visit | Attending: Family Medicine | Admitting: Family Medicine

## 2020-04-24 DIAGNOSIS — N63 Unspecified lump in unspecified breast: Secondary | ICD-10-CM

## 2020-08-27 ENCOUNTER — Encounter: Payer: Self-pay | Admitting: Family Medicine

## 2020-08-27 ENCOUNTER — Telehealth (INDEPENDENT_AMBULATORY_CARE_PROVIDER_SITE_OTHER): Payer: Managed Care, Other (non HMO) | Admitting: Family Medicine

## 2020-08-27 VITALS — Temp 97.6°F | Ht 60.0 in | Wt 122.0 lb

## 2020-08-27 DIAGNOSIS — J029 Acute pharyngitis, unspecified: Secondary | ICD-10-CM

## 2020-08-27 MED ORDER — CEPHALEXIN 500 MG PO CAPS: 500.0000 mg | ORAL_CAPSULE | Freq: Three times a day (TID) | ORAL | 0 refills | Status: AC

## 2020-08-27 NOTE — Progress Notes (Signed)
   Subjective:    Patient ID: Katelyn Jenkins, female    DOB: 11/23/1970, 50 y.o.   MRN: [id number]  HPI Virtual Visit via Video Note  I connected with the patient on 08/27/20 at  2:15 PM EST by a video enabled telemedicine application and verified that I am speaking with the correct person using two identifiers.  Location patient: home Location provider:work or home office Persons participating in the virtual visit: patient, provider  I discussed the limitations of evaluation and management by telemedicine and the availability of in person appointments. The patient expressed understanding and agreed to proceed.   HPI: Here for 5 days of low grade fevers and a bad ST. No headache or body aches. No cough or SOB. No NVD. Taking Tylenol. She has tested negative for the Covid-19 virus.    ROS: See pertinent positives and negatives per HPI.  Past Medical History:  Diagnosis Date  . Anemia   . Constipation   . GERD (gastroesophageal reflux disease)   . Migraine headache     Past Surgical History:  Procedure Laterality Date  . TUBAL LIGATION      Family History  Problem Relation Age of Onset  . Kidney disease Father   . Hypertension Mother   . Cancer Neg Hx      Current Outpatient Medications:  .  cephALEXin (KEFLEX) 500 MG capsule, Take 1 capsule (500 mg total) by mouth 3 (three) times daily for 10 days., Disp: 30 capsule, Rfl: 0  EXAM:  VITALS per patient if applicable:  GENERAL: alert, oriented, appears well and in no acute distress  HEENT: atraumatic, conjunttiva clear, no obvious abnormalities on inspection of external nose and ears  NECK: normal movements of the head and neck  LUNGS: on inspection no signs of respiratory distress, breathing rate appears normal, no obvious gross SOB, gasping or wheezing  CV: no obvious cyanosis  MS: moves all visible extremities without noticeable abnormality  PSYCH/NEURO: pleasant and cooperative, no obvious depression or  anxiety, speech and thought processing grossly intact  ASSESSMENT AND PLAN: Pharyngitis, likely streptococcal. Treat with 10 days of Keflex. Recheck as needed.  Gershon Crane, MD  Discussed the following assessment and plan:  No diagnosis found.     I discussed the assessment and treatment plan with the patient. The patient was provided an opportunity to ask questions and all were answered. The patient agreed with the plan and demonstrated an understanding of the instructions.   The patient was advised to call back or seek an in-person evaluation if the symptoms worsen or if the condition fails to improve as anticipated.     Review of Systems     Objective:   Physical Exam        Assessment & Plan:

## 2020-09-03 ENCOUNTER — Encounter: Payer: Self-pay | Admitting: Family Medicine

## 2020-09-03 ENCOUNTER — Other Ambulatory Visit: Payer: Self-pay

## 2020-09-03 ENCOUNTER — Ambulatory Visit (INDEPENDENT_AMBULATORY_CARE_PROVIDER_SITE_OTHER): Payer: Managed Care, Other (non HMO)

## 2020-09-03 ENCOUNTER — Ambulatory Visit: Payer: Managed Care, Other (non HMO) | Admitting: Family Medicine

## 2020-09-03 VITALS — BP 100/70 | HR 73 | Resp 12 | Ht 60.0 in | Wt 122.4 lb

## 2020-09-03 DIAGNOSIS — D72819 Decreased white blood cell count, unspecified: Secondary | ICD-10-CM | POA: Diagnosis not present

## 2020-09-03 DIAGNOSIS — M545 Low back pain, unspecified: Secondary | ICD-10-CM | POA: Diagnosis not present

## 2020-09-03 DIAGNOSIS — R1011 Right upper quadrant pain: Secondary | ICD-10-CM | POA: Diagnosis not present

## 2020-09-03 DIAGNOSIS — G8929 Other chronic pain: Secondary | ICD-10-CM

## 2020-09-03 DIAGNOSIS — Z1211 Encounter for screening for malignant neoplasm of colon: Secondary | ICD-10-CM | POA: Diagnosis not present

## 2020-09-03 LAB — COMPREHENSIVE METABOLIC PANEL
ALT: 14 U/L (ref 0–35)
AST: 17 U/L (ref 0–37)
Albumin: 4.4 g/dL (ref 3.5–5.2)
Alkaline Phosphatase: 54 U/L (ref 39–117)
BUN: 13 mg/dL (ref 6–23)
CO2: 27 mEq/L (ref 19–32)
Calcium: 9.2 mg/dL (ref 8.4–10.5)
Chloride: 107 mEq/L (ref 96–112)
Creatinine, Ser: 0.6 mg/dL (ref 0.40–1.20)
GFR: 106.17 mL/min (ref >60.00)
Glucose, Bld: 87 mg/dL (ref 70–99)
Potassium: 3.8 mEq/L (ref 3.5–5.1)
Sodium: 139 mEq/L (ref 135–145)
Total Bilirubin: 0.8 mg/dL (ref 0.2–1.2)
Total Protein: 6.6 g/dL (ref 6.0–8.3)

## 2020-09-03 LAB — URINALYSIS, ROUTINE W REFLEX MICROSCOPIC
Bilirubin Urine: NEGATIVE
Hgb urine dipstick: NEGATIVE
Ketones, ur: NEGATIVE
Leukocytes,Ua: NEGATIVE
Nitrite: NEGATIVE
RBC / HPF: NONE SEEN (ref 0–?)
Specific Gravity, Urine: >=1.03 — AB (ref 1.000–1.030)
Total Protein, Urine: NEGATIVE
Urine Glucose: NEGATIVE
Urobilinogen, UA: 0.2 (ref 0.0–1.0)
pH: 6 (ref 5.0–8.0)

## 2020-09-03 LAB — CBC
HCT: 35.9 % — ABNORMAL LOW (ref 36.0–46.0)
Hemoglobin: 12.4 g/dL (ref 12.0–15.0)
MCHC: 34.7 g/dL (ref 30.0–36.0)
MCV: 89.2 fl (ref 78.0–100.0)
Platelets: 253 10*3/uL (ref 150.0–400.0)
RBC: 4.02 Mil/uL (ref 3.87–5.11)
RDW: 11.9 % (ref 11.5–15.5)
WBC: 2.7 10*3/uL — ABNORMAL LOW (ref 4.0–10.5)

## 2020-09-03 MED ORDER — TIZANIDINE HCL 4 MG PO TABS: 2.0000 mg | ORAL_TABLET | Freq: Two times a day (BID) | ORAL | 0 refills | Status: DC | PRN

## 2020-09-03 NOTE — Progress Notes (Signed)
Chief Complaint  Patient presents with  . pain in the side   HPI: [name] is a 50 y.o. female, who is here today with above complaint. RUQ abdominal sharp pain intermittently for a year. She is concerned because ot is becoming more frequent. Husband Dx'ed with colon cancer,he is 24; so she is concerned about a serious process.  Pain is not radiated. She has not identified exacerbating or alleviating factors. Not related with food intake. Negative for fever,chills,abdonormal wt loss, CP,N/V,or rash.  Pain usually resolves spontaneously. Last episode was last week , severe.  Lower back pain, intermittent, bilateral, and sometimes interferes with sleep. 4/10, achy. She does not think it is related to RUQ pain. No hx of trauma.  Negative for saddle anesthesia,LE numbness/tingling,or bladder/bowel dysfunction. She has not noted dysuria or gross hematuria.  She is on Cephalexin, prescribed for sore throat. Hx of GERD. She has not noted heartburn or dysphagia.   Review of Systems  Constitutional: Negative for activity change, appetite change, fatigue and fever.  Respiratory: Positive for cough ("little"). Negative for shortness of breath and wheezing.   Cardiovascular: Negative for chest pain, palpitations and leg swelling.  Gastrointestinal:       Negative for changes in bowel habits.  Neurological: Negative for syncope and weakness.  Hematological: Negative for adenopathy. Does not bruise/bleed easily.  Psychiatric/Behavioral: Negative for confusion. The patient is nervous/anxious.   Rest see pertinent positives and negatives per HPI.  No current outpatient medications on file prior to visit.   No current facility-administered medications on file prior to visit.    Past Medical History:  Diagnosis Date  . Anemia   . Constipation   . GERD (gastroesophageal reflux disease)   . Migraine headache    No Known Allergies  Social History   Socioeconomic  History  . Marital status: Married    Spouse name: Not on file  . Number of children: Not on file  . Years of education: Not on file  . Highest education level: Not on file  Occupational History  . Not on file  Tobacco Use  . Smoking status: Never Smoker  . Smokeless tobacco: Never Used  Substance and Sexual Activity  . Alcohol use: No  . Drug use: No  . Sexual activity: Yes    Birth control/protection: Surgical  Other Topics Concern  . Not on file  Social History Narrative   Marital Status:  Married    Children:  G1 P1001 (Son - Katelyn Jenkins)    Pets:  None    Living Situation: Lives with husband.   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  Physical Exam Vitals and nursing note reviewed.  Constitutional:      General: She is not in  acute distress.    Appearance: She is well-developed.  HENT:     Head: Normocephalic and atraumatic.     Mouth/Throat:     Mouth: Oropharynx is clear and moist. Mucous membranes are dry.  Eyes:     Conjunctiva/sclera: Conjunctivae normal.  Cardiovascular:     Rate and Rhythm: Normal rate and regular rhythm.     Heart sounds: No murmur heard.   Pulmonary:     Effort: Pulmonary effort is normal. No respiratory distress.     Breath sounds: Normal breath sounds.  Abdominal:     Palpations: Abdomen is soft. There is no hepatomegaly or mass.     Tenderness: There is no abdominal tenderness. There is  no right CVA tenderness or left CVA tenderness.  Musculoskeletal:        General: No edema.     Thoracic back: No tenderness or bony tenderness.     Lumbar back: No tenderness or bony tenderness. Negative right straight leg raise test and negative left straight leg raise test.  Lymphadenopathy:     Cervical: No cervical adenopathy.  Skin:    General: Skin is warm.     Findings: No erythema or rash.  Neurological:     Mental Status: She is alert and oriented to person, place, and time.     Cranial Nerves: No cranial nerve deficit.     Gait: Gait normal.     Deep Tendon Reflexes: Strength normal.  Psychiatric:        Mood and Affect: Mood and affect normal.     Comments: Well groomed, good eye contact.   ASSESSMENT AND PLAN:  [name] was seen today for pain in the side.  Diagnoses and all orders for this visit: Orders Placed This Encounter  Procedures  . DG Lumbar Spine Complete  . US Abdomen Limited RUQ (LIVER/GB)  . Comprehensive metabolic panel  . CBC  . Urinalysis, Routine w reflex microscopic  . Ambulatory referral to Gastroenterology   Lab Results  Component Value Date   ALT 14 09/03/2020   AST 17 09/03/2020   ALKPHOS 54 09/03/2020   BILITOT 0.8 09/03/2020   Lab Results  Component Value Date   WBC 2.7 (L) 09/03/2020   HGB 12.4 09/03/2020   HCT 35.9 (L) 09/03/2020   MCV 89.2 09/03/2020   PLT 253.0 09/03/2020   Lab Results  Component Value Date   CREATININE 0.60 09/03/2020   BUN 13 09/03/2020   NA 139 09/03/2020   K 3.8 09/03/2020   CL 107 09/03/2020   CO2 27 09/03/2020     RUQ abdominal pain Chronic and getting worse. Abdominal examination today negative. We discussed possible etiologies, could be musculoskeletal. RUQ [place] will be arranged. Instructed about warning signs.  Chronic bilateral low back pain without sciatica Hx and examination today do not suggest a serious process. She agrees with trying Zanaflex, side effects discussed. OTC Icy  hot/asper cream may also help. Further recommendations according to lab and imaging results.  -     tiZANidine (ZANAFLEX) 4 MG tablet; Take 0.5-1 tablets (2-4 mg total) by mouth every 12 (twelve) hours as needed for muscle spasms.  Colon cancer screening -     Ambulatory referral to Gastroenterology   Return if symptoms worsen or fail to improve.   Sayer Masini G. [place], MD  [facility]. [place] office.   A few things to remember from today's visit:   Colon cancer screening - Plan: Ambulatory referral to Gastroenterology  RUQ abdominal  pain - Plan: US Abdomen Limited RUQ (LIVER/GB), Comprehensive metabolic panel, CBC, Urinalysis, Routine w reflex microscopic  Chronic bilateral low back pain without sciatica - Plan: DG Lumbar Spine Complete, Urinalysis, Routine w reflex microscopic, tiZANidine (ZANAFLEX) 4 MG tablet  If you need refills please call your pharmacy. Do not use My Chart to request refills or for acute issues that need immediate attention.    Urine today is to check for blood. Back pain seems to be muscular. You can take muscle relaxant and needed and topical icy hot/asper cream over the counter.  Please be sure medication list is accurate. If a new problem present, please set up appointment sooner than planned today.

## 2020-09-03 NOTE — Patient Instructions (Addendum)
A few things to remember from today's visit:   Colon cancer screening - Plan: Ambulatory referral to Gastroenterology  RUQ abdominal pain - Plan: US Abdomen Limited RUQ (LIVER/GB), Comprehensive metabolic panel, CBC, Urinalysis, Routine w reflex microscopic  Chronic bilateral low back pain without sciatica - Plan: DG Lumbar Spine Complete, Urinalysis, Routine w reflex microscopic, tiZANidine (ZANAFLEX) 4 MG tablet  If you need refills please call your pharmacy. Do not use My Chart to request refills or for acute issues that need immediate attention.    Urine today is to check for blood. Back pain seems to be muscular. You can take muscle relaxant and needed and topical icy hot/asper cream over the counter.  Please be sure medication list is accurate. If a new problem present, please set up appointment sooner than planned today.

## 2020-09-05 ENCOUNTER — Telehealth: Payer: Self-pay | Admitting: Family Medicine

## 2020-09-05 NOTE — Telephone Encounter (Signed)
Pt is calling in stating that the Rx cephalexin (KEFLEX) 500 MG did not help her and that she is still coughing and has chest congestion, but the other symptoms are gone and would like to see if there is anything that can be given to her for the ongoing condition.  Pt declined an appointment.  Pharm:  [facility] at [address] [place] in [place], [place].

## 2020-09-09 NOTE — Telephone Encounter (Signed)
It is not unusual for cough to last a few more days and even weeks after acute symptoms have resolved. On examination date of visit lungs were clear. We can always arrange CXR to evaluate for pneumonia. Thanks, BJ

## 2020-09-09 NOTE — Telephone Encounter (Signed)
See telephone encounter.

## 2020-10-02 ENCOUNTER — Ambulatory Visit
Admission: RE | Admit: 2020-10-02 | Discharge: 2020-10-02 | Disposition: A | Discharge status: Home / Self Care | Payer: Managed Care, Other (non HMO) | Source: Ambulatory Visit | Attending: Family Medicine | Admitting: Family Medicine

## 2020-10-02 DIAGNOSIS — R1011 Right upper quadrant pain: Secondary | ICD-10-CM

## 2021-01-20 ENCOUNTER — Ambulatory Visit: Payer: Managed Care, Other (non HMO) | Admitting: Internal Medicine

## 2021-01-20 ENCOUNTER — Ambulatory Visit: Payer: Managed Care, Other (non HMO) | Admitting: Family Medicine

## 2021-02-11 ENCOUNTER — Encounter: Payer: Self-pay | Admitting: Family Medicine

## 2021-02-11 ENCOUNTER — Other Ambulatory Visit: Payer: Self-pay

## 2021-02-11 ENCOUNTER — Ambulatory Visit: Payer: Managed Care, Other (non HMO) | Admitting: Family Medicine

## 2021-02-11 VITALS — BP 118/70 | HR 70 | Temp 98.0°F | Resp 12 | Ht 60.0 in | Wt 123.1 lb

## 2021-02-11 DIAGNOSIS — R1010 Upper abdominal pain, unspecified: Secondary | ICD-10-CM | POA: Diagnosis not present

## 2021-02-11 DIAGNOSIS — E781 Pure hyperglyceridemia: Secondary | ICD-10-CM | POA: Diagnosis not present

## 2021-02-11 DIAGNOSIS — K7689 Other specified diseases of liver: Secondary | ICD-10-CM | POA: Diagnosis not present

## 2021-02-11 DIAGNOSIS — L608 Other nail disorders: Secondary | ICD-10-CM

## 2021-02-11 DIAGNOSIS — E538 Deficiency of other specified B group vitamins: Secondary | ICD-10-CM | POA: Diagnosis not present

## 2021-02-11 DIAGNOSIS — L301 Dyshidrosis [pompholyx]: Secondary | ICD-10-CM | POA: Insufficient documentation

## 2021-02-11 DIAGNOSIS — E559 Vitamin D deficiency, unspecified: Secondary | ICD-10-CM

## 2021-02-11 DIAGNOSIS — R439 Unspecified disturbances of smell and taste: Secondary | ICD-10-CM

## 2021-02-11 DIAGNOSIS — R1011 Right upper quadrant pain: Secondary | ICD-10-CM | POA: Insufficient documentation

## 2021-02-11 LAB — LIPID PANEL
Cholesterol: 214 mg/dL — ABNORMAL HIGH (ref 0–200)
HDL: 51.5 mg/dL (ref >39.00)
NonHDL: 162.14
Total CHOL/HDL Ratio: 4
Triglycerides: 216 mg/dL — ABNORMAL HIGH (ref 0.0–149.0)
VLDL: 43.2 mg/dL — ABNORMAL HIGH (ref 0.0–40.0)

## 2021-02-11 LAB — VITAMIN B12: Vitamin B-12: 464 pg/mL (ref 211–911)

## 2021-02-11 LAB — LDL CHOLESTEROL, DIRECT: Direct LDL: 117 mg/dL

## 2021-02-11 LAB — VITAMIN D 25 HYDROXY (VIT D DEFICIENCY, FRACTURES): VITD: 34.21 ng/mL (ref 30.00–100.00)

## 2021-02-11 MED ORDER — TRIAMCINOLONE ACETONIDE 0.1 % EX CREA: 1.0000 "application " | TOPICAL_CREAM | Freq: Every day | CUTANEOUS | 1 refills | Status: DC | PRN

## 2021-02-11 NOTE — Assessment & Plan Note (Signed)
Continue current dose of vitamin D supplementation. Further recommendation will be given according to 25 OH vitamin D result. 

## 2021-02-11 NOTE — Patient Instructions (Signed)
A few things to remember from today's visit:   Upper abdominal pain - Plan: CT Abdomen Pelvis Wo Contrast  Liver cyst - Plan: CT Abdomen Pelvis Wo Contrast  Smell disturbance - Plan: Ambulatory referral to ENT  Vitamin D deficiency - Plan: VITAMIN D 25 Hydroxy (Vit-D Deficiency, Fractures)  Hypertriglyceridemia - Plan: Lipid panel  B12 deficiency - Plan: Vitamin B12  Eczema, unspecified type - Plan: triamcinolone cream (KENALOG) 0.1 %  Over the counter nail and hair supplements may help with nails.  If you need refills please call your pharmacy. Do not use My Chart to request refills or for acute issues that need immediate attention.   Cream on affected areas of hand at night, small amount, place a glove. Nasal saline irrigations in nose may help. Preventing High Cholesterol Cholesterol is a white, waxy substance similar to fat that the human body needs to help build cells. The liver makes all the cholesterol that a person's body needs. Having high cholesterol (hypercholesterolemia) increases your risk for heart disease and stroke. Extra or excess cholesterolcomes from the food that you eat. High cholesterol can often be prevented with diet and lifestyle changes. If you already have high cholesterol, you can control it with diet, lifestyle changes,and medicines. How can high cholesterol affect me? If you have high cholesterol, fatty deposits (plaques) may build up on the walls of your blood vessels. The blood vessels that carry blood away from your heart are called arteries. Plaques make the arteries narrower and stiffer. This in turn can: Restrict or block blood flow and cause blood clots to form. Increase your risk for heart attack and stroke. What can increase my risk for high cholesterol? This condition is more likely to develop in people who: Eat foods that are high in saturated fat or cholesterol. Saturated fat is mostly found in foods that come from animal sources. Are  overweight. Are not getting enough exercise. Have a family history of high cholesterol (familial hypercholesterolemia). What actions can I take to prevent this? Nutrition  Eat less saturated fat. Avoid trans fats (partially hydrogenated oils). These are often found in margarine and in some baked goods, fried foods, and snacks bought in packages. Avoid precooked or cured meat, such as bacon, sausages, or meat loaves. Avoid foods and drinks that have added sugars. Eat more fruits, vegetables, and whole grains. Choose healthy sources of protein, such as fish, poultry, lean cuts of red meat, beans, peas, lentils, and nuts. Choose healthy sources of fat, such as: Nuts. Vegetable oils, especially olive oil. Fish that have healthy fats, such as omega-3 fatty acids. These fish include mackerel or salmon.  Lifestyle Lose weight if you are overweight. Maintaining a healthy body mass index (BMI) can help prevent or control high cholesterol. It can also lower your risk for diabetes and high blood pressure. Ask your health care provider to help you with a diet and exercise plan to lose weight safely. Do not use any products that contain nicotine or tobacco, such as cigarettes, e-cigarettes, and chewing tobacco. If you need help quitting, ask your health care provider. Alcohol use Do not drink alcohol if: Your health care provider tells you not to drink. You are pregnant, may be pregnant, or are planning to become pregnant. If you drink alcohol: Limit how much you use to: 0-1 drink a day for women. 0-2 drinks a day for men. Be aware of how much alcohol is in your drink. In the [place] one drink equals one 12 oz  bottle of beer (355 mL), one 5 oz glass of wine (148 mL), or one 1 oz glass of hard liquor (44 mL). Activity  Get enough exercise. Do exercises as told by your health care provider. Each week, do at least 150 minutes of exercise that takes a medium level of effort (moderate-intensity  exercise). This kind of exercise: Makes your heart beat faster while allowing you to still be able to talk. Can be done in short sessions several times a day or longer sessions a few times a week. For example, on 5 days each week, you could walk fast or ride your bike 3 times a day for 10 minutes each time.  Medicines Your health care provider may recommend medicines to help lower cholesterol. This may be a medicine to lower the amount of cholesterol that your liver makes. You may need medicine if: Diet and lifestyle changes have not lowered your cholesterol enough. You have high cholesterol and other risk factors for heart disease or stroke. Take over-the-counter and prescription medicines only as told by your health care provider. General information Manage your risk factors for high cholesterol. Talk with your health care provider about all your risk factors and how to lower your risk. Manage other conditions that you have, such as diabetes or high blood pressure (hypertension). Have blood tests to check your cholesterol levels at regular points in time as told by your health care provider. Keep all follow-up visits as told by your health care provider. This is important. Where to find more information American Heart Association: www.heart.org National Heart, Lung, and Blood Institute: Pop[employer].is Summary High cholesterol increases your risk for heart disease and stroke. By keeping your cholesterol level low, you can reduce your risk for these conditions. High cholesterol can often be prevented with diet and lifestyle changes. Work with your health care provider to manage your risk factors, and have your blood tested regularly. This information is not intended to replace advice given to you by your health care provider. Make sure you discuss any questions you have with your healthcare provider. Document Revised: 05/22/2019 Document Reviewed: 05/22/2019 Elsevier Patient Education   2022 Elsevier Inc.   Please be sure medication list is accurate. If a new problem present, please set up appointment sooner than planned today.

## 2021-02-11 NOTE — Assessment & Plan Note (Signed)
She has not taking B12 supplementation consistently. Further recommendation will be given according to B12 results.

## 2021-02-11 NOTE — Assessment & Plan Note (Signed)
Low-fat diet recommended for now. Further recommendation will be given according to lipid panel results and 10-year CVD risk score.

## 2021-02-11 NOTE — Progress Notes (Signed)
Chief Complaint  Patient presents with   problem with smelling    Started in January, comes & goes, smelling smoke frequently.    Abdominal Pain   Rash   HPI: Katelyn Jenkins is a 50 y.o. female with history of GERD, hyperlipidemia, IBS, and vitamin D deficiency here today with above complaints.  Since 08/2020 she has noted intermittent smoke like small when there is not any around her. Problem last for a few minutes. She has not identified exacerbating or alleviating factors.  She denies using intranasal medications. No hx of URI prior to symptom onset. Negative for fever, chills, abnormal weight loss, nasal congestion, rhinorrhea, sore throat, changes in taste, earache, or swollen glands. Problem is stable.  -Abdominal pain: She has had RUQ/epigastric abdominal pain for over a year now. Seen on 09/03/2020, when she was reporting increasing in frequency. Still having pain in right-sided upper abdomen. Pain is not longer sharp, "very mild", gradual onset. Self limited. She does not take medication. She is concerned about possibility of liver cancer. She would like a scan of her "digestive system." Denies associated cough,wheezing,or SOB.  She has pain 1-2 per week. She has not identified exacerbating or alleviating factors. She does have pain at this time. Negative for associated heartburn, nausea, vomiting, changes in bowel habits, urinary symptoms, or skin rash.  Lab Results  Component Value Date   ALT 14 09/03/2020   AST 17 09/03/2020   ALKPHOS 54 09/03/2020   BILITOT 0.8 09/03/2020   -Some fingernail with prominent lines, she wonders if she is having any vitamin deficiency. History of B12 deficiency:She is taking B12 supplementation.  Lab Results  Component Value Date   VITAMINB12 231 02/18/2020   Vitamin D deficiency: She has not been consistent with taking vitamin D supplementation. Last 25 OH vitamin D was 20 in 01/2020.  She is also concerned about her  cholesterol, she would like to have it checked today. She has not been consistent with following dietary recommendations, she is afraid that numbers may be worse.  Lab Results  Component Value Date   CHOL 153 02/18/2020   HDL 39.40 02/18/2020   LDLCALC 76 02/18/2020   LDLDIRECT 89.0 11/30/2016   TRIG 184.0 (H) 02/18/2020   CHOLHDL 4 02/18/2020   -Pruritis rash on hands, usually during late spring and Fall.  Problem has been going on for a couple years. Initially she noticed small bumps with clear fluid then scaly areas when bumps burst. She has not tried OTC medication.  Review of Systems  Constitutional:  Negative for activity change, appetite change, fatigue and fever.  HENT:  Negative for mouth sores, nosebleeds and trouble swallowing.   Cardiovascular:  Negative for chest pain, palpitations and leg swelling.  Endocrine: Negative for cold intolerance, heat intolerance, polydipsia, polyphagia and polyuria.  Genitourinary:  Negative for decreased urine volume, dysuria and hematuria.  Musculoskeletal:  Negative for gait problem and myalgias.  Neurological:  Negative for syncope, weakness and headaches.  Hematological:  Negative for adenopathy. Does not bruise/bleed easily.  Psychiatric/Behavioral:  Negative for confusion. The patient is nervous/anxious.   Rest see pertinent positives and negatives per HPI.  No current outpatient medications on file prior to visit.   No current facility-administered medications on file prior to visit.   Past Medical History:  Diagnosis Date   Anemia    Constipation    GERD (gastroesophageal reflux disease)    Migraine headache    No Known Allergies  Social History   Socioeconomic  History   Marital status: Married    Spouse name: Not on file   Number of children: Not on file   Years of education: Not on file   Highest education level: Not on file  Occupational History   Not on file  Tobacco Use   Smoking status: Never   Smokeless  tobacco: Never  Substance and Sexual Activity   Alcohol use: No   Drug use: No   Sexual activity: Yes    Birth control/protection: Surgical  Other Topics Concern   Not on file  Social History Narrative   Marital Status:  Married    Children:  G1 P1001 (Son - Manufacturing systems engineerliver)    Pets:  None    Living Situation: Lives with husband.   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[employer])       Education:  Manufacturing engineerMaster's Degree (Information Systems):  [employer]    Tobacco Use/Exposure:  None    Alcohol Use:  None    Drug Use:  None   Diet:  Regular   Exercise:  Limited    Hobbies:  Reading          Social Determinants of Corporate investment bankerHealth   Financial Resource Strain: Not on file  Food Insecurity: Not on file  Transportation Needs: Not on file  Physical Activity: Not on file  Stress: Not on file  Social Connections: Not on file    Vitals:   02/11/21 0926  BP: 118/70  Pulse: 70  Resp: 12  Temp: 98 F (36.7 C)  SpO2: 99%   Body mass index is 24.05 kg/m.  Physical Exam Vitals and nursing note reviewed.  Constitutional:      General: She is not in acute distress.    Appearance: She is well-developed.  HENT:     Head: Normocephalic and atraumatic.     Nose: Septal deviation present.     Right Turbinates: Not enlarged.     Left Turbinates: Enlarged.     Right Sinus: No maxillary sinus tenderness or frontal sinus tenderness.     Left Sinus: No maxillary sinus tenderness or frontal sinus tenderness.     Comments: Mildly hyperemic nasal mucosa.    Mouth/Throat:     Mouth: Mucous membranes are moist.     Pharynx: Oropharynx is clear.  Eyes:     Conjunctiva/sclera: Conjunctivae normal.  Cardiovascular:     Rate and Rhythm: Normal rate and regular rhythm.     Heart sounds: No murmur heard. Pulmonary:     Effort: Pulmonary effort is normal. No respiratory distress.     Breath sounds: Normal breath sounds.  Abdominal:     Palpations: Abdomen is soft. There is no hepatomegaly or mass.     Tenderness:  There is no abdominal tenderness.  Lymphadenopathy:     Cervical: No cervical adenopathy.  Skin:    General: Skin is warm.     Findings: No erythema or rash.     Comments: Right tumb scaly areas. No erythema or papular lesions. Fingernails smooth, some have thin vertical lines, not very prominent.  Neurological:     General: No focal deficit present.     Mental Status: She is alert and oriented to person, place, and time.     Cranial Nerves: No cranial nerve deficit.     Gait: Gait normal.  Psychiatric:        Mood and Affect: Mood is anxious.     Comments: Well groomed, good eye contact.   ASSESSMENT AND PLAN:  Katelyn Jenkins was seen today for problem with smelling, abdominal pain and rash.  Diagnoses and all orders for this visit: Orders Placed This Encounter  Procedures   CT Abdomen Pelvis Wo Contrast   Lipid panel   Vitamin B12   VITAMIN D 25 Hydroxy (Vit-D Deficiency, Fractures)   LDL cholesterol, direct   Ambulatory referral to ENT   Lab Results  Component Value Date   VITAMINB12 464 02/11/2021   Lab Results  Component Value Date   CHOL 214 (H) 02/11/2021   HDL 51.50 02/11/2021   LDLCALC 76 02/18/2020   LDLDIRECT 117.0 02/11/2021   TRIG 216.0 (H) 02/11/2021   CHOLHDL 4 02/11/2021   The 10-year ASCVD risk score Denman George DC Jr., et al., 2013) is: 1.1%   Values used to calculate the score:     Age: 18 years     Sex: Female     Is Non-Hispanic African American: No     Diabetic: No     Tobacco smoker: No     Systolic Blood Pressure: 118 mmHg     Is BP treated: No     HDL Cholesterol: 51.5 mg/dL     Total Cholesterol: 214 mg/dL   Vitamin D deficiency Continue current dose of vitamin D supplementation. Further recommendation will be given according to 25 OH vitamin D result.  Hypertriglyceridemia Low-fat diet recommended for now. Further recommendation will be given according to lipid panel results and 10-year CVD risk score.  Upper abdominal pain This  problem is chronic. History and examination today do not suggest a serious process. Could be related to IBS. Abdominal CT will be arranged. Instructed about warning signs.  Dyshidrotic eczema Educated about diagnosis, prognosis, and treatment options. Recommend topical steroid, small amount, ideally at night and to place a cotton glove to prevent steroid contact with face. F/U as needed.  B12 deficiency She has not taking B12 supplementation consistently. Further recommendation will be given according to B12 results.  Liver cyst We discussed possible etiologies, most likely benign. Abdominal CT will be arranged.  Smell disturbance Not sure about etiology. Allergy rhinitis could be a contributing factor, she denies symptoms. She is very concerned about this, she would like ENT evaluation.  Change in nail appearance Reassured. She could try OTC nail/hair supplements.  Spent 42 minutes.  During this time history was obtained and documented, examination was performed, prior labs/imaging reviewed, and assessment/plan discussed.  Return if symptoms worsen or fail to improve.   Nichelle Renwick G. [place], MD  [facility]. [place] office.  A few things to remember from today's visit:   Upper abdominal pain - Plan: CT Abdomen Pelvis Wo Contrast  Liver cyst - Plan: CT Abdomen Pelvis Wo Contrast  Smell disturbance - Plan: Ambulatory referral to ENT  Vitamin D deficiency - Plan: VITAMIN D 25 Hydroxy (Vit-D Deficiency, Fractures)  Hypertriglyceridemia - Plan: Lipid panel  B12 deficiency - Plan: Vitamin B12  Eczema, unspecified type - Plan: triamcinolone cream (KENALOG) 0.1 %  Over the counter nail and hair supplements may help with nails.  If you need refills please call your pharmacy. Do not use My Chart to request refills or for acute issues that need immediate attention.   Cream on affected areas of hand at night, small amount, place a glove. Nasal saline  irrigations in nose may help. Preventing High Cholesterol Cholesterol is a white, waxy substance similar to fat that the human body needs to help build cells. The liver makes all the cholesterol that  a person's body needs. Having high cholesterol (hypercholesterolemia) increases your risk for heart disease and stroke. Extra or excess cholesterolcomes from the food that you eat. High cholesterol can often be prevented with diet and lifestyle changes. If you already have high cholesterol, you can control it with diet, lifestyle changes,and medicines. How can high cholesterol affect me? If you have high cholesterol, fatty deposits (plaques) may build up on the walls of your blood vessels. The blood vessels that carry blood away from your heart are called arteries. Plaques make the arteries narrower and stiffer. This in turn can: Restrict or block blood flow and cause blood clots to form. Increase your risk for heart attack and stroke. What can increase my risk for high cholesterol? This condition is more likely to develop in people who: Eat foods that are high in saturated fat or cholesterol. Saturated fat is mostly found in foods that come from animal sources. Are overweight. Are not getting enough exercise. Have a family history of high cholesterol (familial hypercholesterolemia). What actions can I take to prevent this? Nutrition  Eat less saturated fat. Avoid trans fats (partially hydrogenated oils). These are often found in margarine and in some baked goods, fried foods, and snacks bought in packages. Avoid precooked or cured meat, such as bacon, sausages, or meat loaves. Avoid foods and drinks that have added sugars. Eat more fruits, vegetables, and whole grains. Choose healthy sources of protein, such as fish, poultry, lean cuts of red meat, beans, peas, lentils, and nuts. Choose healthy sources of fat, such as: Nuts. Vegetable oils, especially olive oil. Fish that have healthy fats,  such as omega-3 fatty acids. These fish include mackerel or salmon.  Lifestyle Lose weight if you are overweight. Maintaining a healthy body mass index (BMI) can help prevent or control high cholesterol. It can also lower your risk for diabetes and high blood pressure. Ask your health care provider to help you with a diet and exercise plan to lose weight safely. Do not use any products that contain nicotine or tobacco, such as cigarettes, e-cigarettes, and chewing tobacco. If you need help quitting, ask your health care provider. Alcohol use Do not drink alcohol if: Your health care provider tells you not to drink. You are pregnant, may be pregnant, or are planning to become pregnant. If you drink alcohol: Limit how much you use to: 0-1 drink a day for women. 0-2 drinks a day for men. Be aware of how much alcohol is in your drink. In the [place] one drink equals one 12 oz bottle of beer (355 mL), one 5 oz glass of wine (148 mL), or one 1 oz glass of hard liquor (44 mL). Activity  Get enough exercise. Do exercises as told by your health care provider. Each week, do at least 150 minutes of exercise that takes a medium level of effort (moderate-intensity exercise). This kind of exercise: Makes your heart beat faster while allowing you to still be able to talk. Can be done in short sessions several times a day or longer sessions a few times a week. For example, on 5 days each week, you could walk fast or ride your bike 3 times a day for 10 minutes each time.  Medicines Your health care provider may recommend medicines to help lower cholesterol. This may be a medicine to lower the amount of cholesterol that your liver makes. You may need medicine if: Diet and lifestyle changes have not lowered your cholesterol enough. You have high cholesterol and  other risk factors for heart disease or stroke. Take over-the-counter and prescription medicines only as told by your health care provider. General  information Manage your risk factors for high cholesterol. Talk with your health care provider about all your risk factors and how to lower your risk. Manage other conditions that you have, such as diabetes or high blood pressure (hypertension). Have blood tests to check your cholesterol levels at regular points in time as told by your health care provider. Keep all follow-up visits as told by your health care provider. This is important. Where to find more information American Heart Association: www.heart.org National Heart, Lung, and Blood Institute: PopSteam.is Summary High cholesterol increases your risk for heart disease and stroke. By keeping your cholesterol level low, you can reduce your risk for these conditions. High cholesterol can often be prevented with diet and lifestyle changes. Work with your health care provider to manage your risk factors, and have your blood tested regularly. This information is not intended to replace advice given to you by your health care provider. Make sure you discuss any questions you have with your healthcare provider. Document Revised: 05/22/2019 Document Reviewed: 05/22/2019 Elsevier Patient Education  2022 Elsevier Inc.   Please be sure medication list is accurate. If a new problem present, please set up appointment sooner than planned today.

## 2021-02-11 NOTE — Assessment & Plan Note (Signed)
This problem is chronic. History and examination today do not suggest a serious process. Could be related to IBS. Abdominal CT will be arranged. Instructed about warning signs.

## 2021-02-11 NOTE — Assessment & Plan Note (Signed)
Educated about diagnosis, prognosis, and treatment options. Recommend topical steroid, small amount, ideally at night and to place a cotton glove to prevent steroid contact with face. F/U as needed.

## 2021-02-11 NOTE — Assessment & Plan Note (Signed)
We discussed possible etiologies, most likely benign. Abdominal CT will be arranged.

## 2021-02-16 NOTE — Addendum Note (Signed)
Addended by: Kathreen Devoid on: 02/16/2021 04:23 PM   Modules accepted: Orders

## 2021-03-10 ENCOUNTER — Inpatient Hospital Stay: Admission: RE | Admit: 2021-03-10 | Payer: Managed Care, Other (non HMO) | Source: Ambulatory Visit

## 2021-04-09 ENCOUNTER — Other Ambulatory Visit: Payer: Self-pay | Admitting: Family Medicine

## 2021-04-09 DIAGNOSIS — Z1231 Encounter for screening mammogram for malignant neoplasm of breast: Secondary | ICD-10-CM

## 2021-04-29 ENCOUNTER — Ambulatory Visit: Payer: Managed Care, Other (non HMO)

## 2021-04-30 ENCOUNTER — Ambulatory Visit
Admission: RE | Admit: 2021-04-30 | Discharge: 2021-04-30 | Disposition: A | Discharge status: Home / Self Care | Payer: Managed Care, Other (non HMO) | Source: Ambulatory Visit | Attending: Family Medicine | Admitting: Family Medicine

## 2021-04-30 ENCOUNTER — Other Ambulatory Visit: Payer: Self-pay

## 2021-04-30 DIAGNOSIS — Z1231 Encounter for screening mammogram for malignant neoplasm of breast: Secondary | ICD-10-CM

## 2021-06-24 ENCOUNTER — Encounter: Payer: Self-pay | Admitting: Gastroenterology

## 2021-08-06 ENCOUNTER — Other Ambulatory Visit: Payer: Self-pay

## 2021-08-06 ENCOUNTER — Ambulatory Visit (AMBULATORY_SURGERY_CENTER): Payer: Managed Care, Other (non HMO) | Admitting: *Deleted

## 2021-08-06 VITALS — Ht 60.0 in | Wt 125.0 lb

## 2021-08-06 DIAGNOSIS — Z1211 Encounter for screening for malignant neoplasm of colon: Secondary | ICD-10-CM

## 2021-08-06 MED ORDER — NA SULFATE-K SULFATE-MG SULF 17.5-3.13-1.6 GM/177ML PO SOLN: 1.0000 | Freq: Once | ORAL | 0 refills | Status: AC

## 2021-08-06 NOTE — Progress Notes (Signed)
Virtual pre visit completed over telephone.  Instructions forwarded through MyChart and secure e-mail [email or web address]     No egg or soy allergy known to patient  No issues known to pt with past sedation with any surgeries or procedures Patient denies ever being told they had issues or difficulty with intubation  No FH of Malignant Hyperthermia Pt is not on diet pills Pt is not on  home 02  Pt is not on blood thinners  Pt denies issues with constipation  No A fib or A flutter  Pt is fully vaccinated  for Covid   Discussed with pt there will be an out-of-pocket cost for prep and that varies from $0 to 70 +  dollars - pt verbalized understanding   Due to the COVID-19 pandemic we are asking patients to follow certain guidelines in [facility] and the [facility]   Pt aware of COVID protocols and [facility] guidelines

## 2021-08-20 ENCOUNTER — Encounter: Payer: Managed Care, Other (non HMO) | Admitting: Gastroenterology

## 2021-09-04 ENCOUNTER — Encounter: Payer: Self-pay | Admitting: Gastroenterology

## 2021-09-07 ENCOUNTER — Encounter: Payer: Managed Care, Other (non HMO) | Admitting: Gastroenterology

## 2021-09-10 ENCOUNTER — Ambulatory Visit (AMBULATORY_SURGERY_CENTER): Payer: Managed Care, Other (non HMO) | Admitting: Gastroenterology

## 2021-09-10 ENCOUNTER — Encounter: Payer: Self-pay | Admitting: Gastroenterology

## 2021-09-10 VITALS — BP 107/61 | HR 69 | Temp 98.4°F | Resp 19 | Ht 60.0 in | Wt 123.0 lb

## 2021-09-10 DIAGNOSIS — Z1211 Encounter for screening for malignant neoplasm of colon: Secondary | ICD-10-CM | POA: Diagnosis not present

## 2021-09-10 MED ORDER — SODIUM CHLORIDE 0.9 % IV SOLN: 500.0000 mL | INTRAVENOUS | Status: DC

## 2021-09-10 NOTE — Progress Notes (Signed)
[facility] History and Physical   Primary Care Physician:  [place], Betty G, MD   Reason for Procedure:   Colon cancer screening  Plan:    Screening colonoscopy     HPI: Katelyn Jenkins is a 51 y.o. female undergoing initial average risk screening colonoscopy.  She has no family history of colon cancer and no chronic GI symptoms.  She had a colonoscopy many years ago because of constipation, which was normal per patient. Her constipation has improved.   Past Medical History:  Diagnosis Date   Anemia    Constipation    GERD (gastroesophageal reflux disease)    Migraine headache     Past Surgical History:  Procedure Laterality Date   TUBAL LIGATION      Prior to Admission medications   Medication Sig Start Date End Date Taking? Authorizing Provider  triamcinolone cream (KENALOG) 0.1 % Apply 1 application topically daily as needed. Hands 02/11/21   [place], Betty G, MD    Current Outpatient Medications  Medication Sig Dispense Refill   triamcinolone cream (KENALOG) 0.1 % Apply 1 application topically daily as needed. Hands 30 g 1   Current Facility-Administered Medications  Medication Dose Route Frequency Provider Last Rate Last Admin   0.9 %  sodium chloride infusion  500 mL Intravenous Continuous Jenel Lucks, MD        Allergies as of 09/10/2021   (No Known Allergies)    Family History  Problem Relation Age of Onset   Hypertension Mother    Kidney disease Father    Cancer Neg Hx    Colon cancer Neg Hx    Colon polyps Neg Hx    Esophageal cancer Neg Hx    Rectal cancer Neg Hx    Stomach cancer Neg Hx     Social History   Socioeconomic History   Marital status: Married    Spouse name: Not on file   Number of children: Not on file   Years of education: Not on file   Highest education level: Not on file  Occupational History   Not on file  Tobacco Use   Smoking status: Never   Smokeless tobacco: Never  Substance and Sexual Activity    Alcohol use: No   Drug use: No   Sexual activity: Yes    Birth control/protection: Surgical  Other Topics Concern   Not on file  Social History Narrative   Marital Status:  Married    Children:  G1 P1001 (Son - Manufacturing systems engineer)    Pets:  None    Living Situation: Lives with husband.   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  Physical Exam: Vital signs BP 135/76    Pulse 73    Temp 98.4 F (36.9 C) (Temporal)  Ht 5' (1.524 m)    Wt 123 lb (55.8 kg)    SpO2 100%    BMI 24.02 kg/m   General:   Alert,  Well-developed, well-nourished, pleasant and cooperative in NAD Airway:  Mallampati 2 Lungs:  Clear throughout to auscultation.   Heart:  Regular rate and rhythm; no murmurs, clicks, rubs,  or gallops. Abdomen:  Soft, nontender and nondistended. Normal bowel sounds.   Neuro/Psych:  Normal mood and affect. A and O x 3   Katelyn Hayner E. Tomasa Rand, MD [facility]

## 2021-09-10 NOTE — Progress Notes (Signed)
Pt's states no medical or surgical changes since previsit or office visit. 

## 2021-09-10 NOTE — Progress Notes (Signed)
Vss nad trans to pacu °

## 2021-09-10 NOTE — Op Note (Signed)
[facility] Patient Name: Katelyn Jenkins Procedure Date: 09/10/2021 1:15 PM MRN: [id number] Endoscopist: Lorin Picket E. Tomasa Rand , MD Age: 51 Referring MD:  Date of Birth: 27-Feb-1971 Gender: Female Account #: [id number] Procedure:                Colonoscopy Indications:              Screening for colorectal malignant neoplasm Medicines:                Monitored Anesthesia Care Procedure:                Pre-Anesthesia Assessment:                           - Prior to the procedure, a History and Physical                            was performed, and patient medications and                            allergies were reviewed. The patient's tolerance of                            previous anesthesia was also reviewed. The risks                            and benefits of the procedure and the sedation                            options and risks were discussed with the patient.                            All questions were answered, and informed consent                            was obtained. Prior Anticoagulants: The patient has                            taken no previous anticoagulant or antiplatelet                            agents. ASA Grade Assessment: II - A patient with                            mild systemic disease. After reviewing the risks                            and benefits, the patient was deemed in                            satisfactory condition to undergo the procedure.                           After obtaining informed consent, the colonoscope  was passed under direct vision. Throughout the                            procedure, the patient's blood pressure, pulse, and                            oxygen saturations were monitored continuously. The                            Olympus PCF-H190DL (#[id number]) Colonoscope was                            introduced through the anus and advanced to the the                            terminal ileum,  with identification of the                            appendiceal orifice and IC valve. The colonoscopy                            was performed without difficulty. The patient                            tolerated the procedure well. The quality of the                            bowel preparation was excellent. The terminal                            ileum, ileocecal valve, appendiceal orifice, and                            rectum were photographed. Scope In: 1:21:23 PM Scope Out: 1:37:03 PM Scope Withdrawal Time: 0 hours 9 minutes 41 seconds  Total Procedure Duration: 0 hours 15 minutes 40 seconds  Findings:                 The perianal and digital rectal examinations were                            normal. Pertinent negatives include normal                            sphincter tone and no palpable rectal lesions.                           The colon (entire examined portion) appeared normal.                           The terminal ileum appeared normal.                           The retroflexed view of the distal rectum and anal  verge was normal and showed no anal or rectal                            abnormalities. Complications:            No immediate complications. Estimated Blood Loss:     Estimated blood loss: none. Impression:               - The entire examined colon is normal.                           - The examined portion of the ileum was normal.                           - The distal rectum and anal verge are normal on                            retroflexion view.                           - No specimens collected. Recommendation:           - Patient has a contact number available for                            emergencies. The signs and symptoms of potential                            delayed complications were discussed with the                            patient. Return to normal activities tomorrow.                            Written discharge  instructions were provided to the                            patient.                           - Resume previous diet.                           - Continue present medications.                           - Repeat colonoscopy in 10 years for screening                            purposes. Devika Dragovich E. Tomasa Randunningham, MD 09/10/2021 1:41:43 PM This report has been signed electronically.

## 2021-09-10 NOTE — Patient Instructions (Signed)

## 2021-09-14 ENCOUNTER — Telehealth: Payer: Self-pay | Admitting: *Deleted

## 2021-09-14 NOTE — Telephone Encounter (Signed)
°  Follow up Call-  Call back number 09/10/2021  Post procedure Call Back phone  # [phone]  Permission to leave phone message Yes  Some recent data might be hidden     Patient questions:  Do you have a fever, pain , or abdominal swelling? No. Pain Score  0 *  Have you tolerated food without any problems? Yes.    Have you been able to return to your normal activities? Yes.    Do you have any questions about your discharge instructions: Diet   No. Medications  No. Follow up visit  No.  Do you have questions or concerns about your Care? No.  Actions: * If pain score is 4 or above: No action needed, pain <4.

## 2021-11-02 ENCOUNTER — Encounter: Payer: Self-pay | Admitting: Family Medicine

## 2021-11-02 ENCOUNTER — Other Ambulatory Visit: Payer: Self-pay

## 2021-11-02 ENCOUNTER — Telehealth (INDEPENDENT_AMBULATORY_CARE_PROVIDER_SITE_OTHER): Payer: Managed Care, Other (non HMO) | Admitting: Family Medicine

## 2021-11-02 VITALS — Temp 98.4°F | Ht 60.0 in

## 2021-11-02 DIAGNOSIS — J069 Acute upper respiratory infection, unspecified: Secondary | ICD-10-CM

## 2021-11-02 NOTE — Progress Notes (Signed)
Virtual Visit via Video Note ?I connected with Katelyn Jenkins on 11/02/21 by a video enabled telemedicine application and verified that I am speaking with the correct person using two identifiers. ? Location patient: home ?Location provider:work office ?Persons participating in the virtual visit: patient, provider ? ?I discussed the limitations of evaluation and management by telemedicine and the availability of in person appointments. The patient expressed understanding and agreed to proceed. ? ?Chief Complaint  ?Patient presents with  ? cold-like symptoms  ?  Sneezing, low grade fever, sore throat, fatigued.   ? ?HPI: ?Katelyn Jenkins is a 51 yo female with hx of GERD and migraine c/o 3 days of respiratory symptoms as described above. ?This past Friday she started with sneezing, ?Temperature 99.9 F,nasal congestion,and sore throat. ?+ Fatigue. ?"Little" cough. ?Negative for anosmia,ageusia,CP, dyspnea, wheezing, abdominal pain, nausea, vomiting, changes in bowel habits, body aches, or skin rash. ?No hx of allergies. ? ?She has taken NyQuil x2 yesterday. ?She performed a home COVID-19 test at home negative, yesterday. ?Her husband was sick with similar symptoms for 1 day. ?No recent travel. ? ?ROS: See pertinent positives and negatives per HPI. ? ?Past Medical History:  ?Diagnosis Date  ? Anemia   ? Constipation   ? GERD (gastroesophageal reflux disease)   ? Migraine headache   ? ? ?Past Surgical History:  ?Procedure Laterality Date  ? TUBAL LIGATION    ? ?Family History  ?Problem Relation Age of Onset  ? Hypertension Mother   ? Kidney disease Father   ? Cancer Neg Hx   ? Colon cancer Neg Hx   ? Colon polyps Neg Hx   ? Esophageal cancer Neg Hx   ? Rectal cancer Neg Hx   ? Stomach cancer Neg Hx   ? ?Social History  ? ?Socioeconomic History  ? Marital status: Married  ?  Spouse name: Not on file  ? Number of children: Not on file  ? Years of education: Not on file  ? Highest education level: Not on file  ?Occupational History   ? Not on file  ?Tobacco Use  ? Smoking status: Never  ? Smokeless tobacco: Never  ?Substance and Sexual Activity  ? Alcohol use: No  ? Drug use: No  ? Sexual activity: Yes  ?  Birth control/protection: Surgical  ?Other Topics Concern  ? Not on file  ?Social History Narrative  ? Marital Status:  Married   ? Children:  G1 P1001 (Son Katelyn Jenkins)   ? Pets:  None   ? Living Situation: Lives with husband.  ? 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 Education:  Manufacturing engineer (Information Systems):  [employer]   ? Tobacco Use/Exposure:  None   ? Alcohol Use:  None   ? Drug Use:  None  ? Diet:  Regular  ? Exercise:  Limited   ? Hobbies:  Reading   ?   ?   ? ?Social Determinants of Health  ? ?Financial Resource Strain: Not on file  ?Food Insecurity: Not on file  ?Transportation Needs: Not on file  ?Physical Activity: Not on file  ?Stress: Not on file  ?Social Connections: Not on file  ?Intimate Partner Violence: Not on file  ? ?Current Outpatient Medications:  ?  triamcinolone cream (KENALOG) 0.1 %, Apply 1 application topically daily as needed. Hands, Disp: 30 g, Rfl: 1 ? ?EXAM: ? ?VITALS per patient if applicable:Temp 98.4 ?F (36.9 ?C)   Ht 5' (1.524 m)  BMI 24.02 kg/m?  ? ?GENERAL: alert, oriented, appears well and in no acute distress ? ?HEENT: atraumatic, conjunctiva clear, no obvious abnormalities on inspection of external nose and ears ?Oropharynx: I do not appreciate erythema, exudate, or edema.  Uvula is centered. ? ?NECK: normal movements of the head and neck ?Mild pain she she palpated right submandibular gland area, no enlarged glands/lymph nodes. ? ?LUNGS: on inspection no signs of respiratory distress, breathing rate appears normal, no obvious gross SOB, gasping or wheezing ? ?CV: no obvious cyanosis ? ?MS: moves all visible extremities without noticeable abnormality ? ?PSYCH/NEURO: pleasant and cooperative, no obvious depression or anxiety, speech and thought processing grossly intact ? ?ASSESSMENT  AND PLAN: ? ?Discussed the following assessment and plan: ? ?URI, acute ?Symptoms suggests a viral etiology, I explained patient that symptomatic treatment is usually recommended in this case, so I do not think abx is needed at this time. ?Instructed to monitor for signs of complications, including fever among some.If fever ,she was instructed to repeat COVID 19 test. ?I also explained that cough and nasal congestion could last a few days and sometimes weeks. ?She is not interested in intranasal Flonase for nasal congestion. ?She can continue NyQuil at bedtime and try DayQuil during the day. If cough gets worse, we can consider Benzonatate. ?Continue monitoring temperature. ?States that she does not need a note for work, she is working from home. ?F/U as needed. ? ?We discussed possible serious and likely etiologies, options for evaluation and workup, limitations of telemedicine visit vs in person visit, treatment, treatment risks and precautions. ?The patient was advised to call back or seek an in-person evaluation if the symptoms worsen or if the condition fails to improve as anticipated. ?I discussed the assessment and treatment plan with the patient. The patient was provided an opportunity to ask questions and all were answered. The patient agreed with the plan and demonstrated an understanding of the instructions. ? ?Return if symptoms worsen or fail to improve. ? ?Lakedra Washington G. [place], MD ? ?[place]. ?[place] office. ? ?

## 2022-05-04 NOTE — Progress Notes (Unsigned)
HPI: [name] is a 51 y.o. female, who is here today for her routine physical.  Last CPE: 02/18/2020  She exercises almost daily, aerobic with video. She has not been consistent with following a healthful diet, trying to cook more at home and eating vegetables.  Chronic medical problems: Vitamin D deficiency, fatty liver, GERD, and migraine headaches among some. Immunization History  Administered Date(s) Administered   Zoster Recombinat (Shingrix) 05/05/2022   Health Maintenance  Topic Date Due   PAP SMEAR-Modifier  12/01/2019   INFLUENZA VACCINE  Never done   COVID-19 Vaccine (1) 05/21/2022 (Originally 04/15/1976)   HIV Screening  02/18/2024 (Originally 04/16/1987)   Zoster Vaccines- Shingrix (2 of 2) 06/30/2022   MAMMOGRAM  05/01/2023   TETANUS/TDAP  01/22/2027   COLONOSCOPY (Pts 45-48yrs Insurance coverage will need to be confirmed)  09/11/2031   Hepatitis C Screening  Completed   HPV VACCINES  Aged Out   Pap smear 2018 negative Pap smear and HPV. Hx of abnormal pap smears: Denies Hx of STD's denies Menarche at age 27. G: 2 L: 2 S/P BTL  Never smoker.  HLD: She is on non pharmacologic treatment. Lab Results  Component Value Date   CHOL 214 (H) 02/11/2021   HDL 51.50 02/11/2021   LDLCALC 76 02/18/2020   LDLDIRECT 117.0 02/11/2021   TRIG 216.0 (H) 02/11/2021   CHOLHDL 4 02/11/2021   She is c/o RUQ pain, problem has been going on for years, at some point it was more frequent but for the past few months it has decreased in frequency. She would like to know what is causing pain. Pain is not radiated. Negative for numbness and tingling. No associated nausea or vomiting. She has not identified exacerbating or alleviating factors. Pain resolves spontaneously. RUQ [place]: Scattered hepatic cysts stable in appearance from the prior exam. Mild fatty infiltration of the liver.  Lab Results  Component Value Date   ALT 14 09/03/2020   AST 17 09/03/2020   ALKPHOS 54  09/03/2020   BILITOT 0.8 09/03/2020   Vit D deficiency: She is not taking vit D supplementation daily but about 2 times per week. 01/2021 25 OH vit D was 34.  Concerned about skin lesions she has noted for a while, no tender. Rough surface and slightly hyperpigmented.Some under right breast and UE's.  Review of Systems  Constitutional:  Negative for activity change, appetite change and fever.  HENT:  Negative for hearing loss, mouth sores, sore throat and trouble swallowing.   Eyes:  Negative for redness and visual disturbance.  Respiratory:  Negative for cough, shortness of breath and wheezing.   Cardiovascular:  Negative for chest pain and leg swelling.  Gastrointestinal:  Positive for abdominal pain. Negative for nausea and vomiting.       No changes in bowel habits.  Endocrine: Negative for cold intolerance, heat intolerance, polydipsia, polyphagia and polyuria.  Genitourinary:  Negative for decreased urine volume, dysuria, hematuria, vaginal bleeding and vaginal discharge.  Musculoskeletal:  Negative for gait problem and myalgias.  Skin:  Negative for color change and rash.  Allergic/Immunologic: Negative for environmental allergies.  Neurological:  Negative for seizures, syncope, weakness and headaches.  Hematological:  Negative for adenopathy. Does not bruise/bleed easily.  Psychiatric/Behavioral:  Negative for confusion. The patient is nervous/anxious.   All other systems reviewed and are negative.  Current Outpatient Medications on File Prior to Visit  Medication Sig Dispense Refill   triamcinolone cream (KENALOG) 0.1 % Apply 1 application topically daily as  needed. Hands 30 g 1   No current facility-administered medications on file prior to visit.   Past Medical History:  Diagnosis Date   Anemia    Constipation    GERD (gastroesophageal reflux disease)    Migraine headache    Past Surgical History:  Procedure Laterality Date   TUBAL LIGATION     No Known  Allergies  Family History  Problem Relation Age of Onset   Hypertension Mother    Kidney disease Father    Cancer Neg Hx    Colon cancer Neg Hx    Colon polyps Neg Hx    Esophageal cancer Neg Hx    Rectal cancer Neg Hx    Stomach cancer Neg Hx     Social History   Socioeconomic History   Marital status: Married    Spouse name: Not on file   Number of children: Not on file   Years of education: Not on file   Highest education level: Not on file  Occupational History   Not on file  Tobacco Use   Smoking status: Never   Smokeless tobacco: Never  Substance and Sexual Activity   Alcohol use: No   Drug use: No   Sexual activity: Yes    Birth control/protection: Surgical  Other Topics Concern   Not on file  Social History Narrative   Marital Status:  Married    Children:  G1 P1001 (Son - Manufacturing systems engineer)    Pets:  None    Living Situation: Lives with husband.   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 Wt Readings from Last 3 Encounters:  05/05/22 123 lb (55.8 kg)  09/10/21 123 lb (55.8 kg)  08/06/21 125 lb (56.7 kg)   Physical Exam Vitals and nursing note reviewed. Exam conducted with a chaperone present.  Constitutional:      General: She is not in acute distress.    Appearance: She is well-developed, well-groomed and normal weight.  HENT:     Head: Normocephalic and atraumatic.     Right Ear: Hearing,  tympanic membrane, ear canal and external ear normal.     Left Ear: Hearing, tympanic membrane, ear canal and external ear normal.     Mouth/Throat:     Mouth: Mucous membranes are moist.     Pharynx: Oropharynx is clear. Uvula midline.  Eyes:     Extraocular Movements: Extraocular movements intact.     Conjunctiva/sclera: Conjunctivae normal.     Pupils: Pupils are equal, round, and reactive to light.  Neck:     Thyroid: No thyromegaly.     Trachea: No tracheal deviation.  Cardiovascular:     Rate and Rhythm: Normal rate and regular rhythm.     Pulses:  Dorsalis pedis pulses are 2+ on the right side and 2+ on the left side.     Heart sounds: No murmur heard. Pulmonary:     Effort: Pulmonary effort is normal. No respiratory distress.     Breath sounds: Normal breath sounds.  Chest:  Breasts:    Right: No inverted nipple, mass, nipple discharge, skin change or tenderness.     Left: No inverted nipple, mass, nipple discharge, skin change or tenderness.     Comments: Fibrocystic like changes in outer upper quadrants. Abdominal:     Palpations: Abdomen is soft. There is no hepatomegaly or mass.     Tenderness: There is no abdominal tenderness.  Genitourinary:    Exam position: Lithotomy position.     Labia:        Right: No rash, tenderness or lesion.        Left: No rash, tenderness or lesion.      Vagina: No signs of injury and foreign body. No vaginal discharge, erythema, tenderness, bleeding or lesions.     Cervix: No cervical motion tenderness, discharge, friability or erythema.     Uterus: Not enlarged and not tender.      Adnexa:        Right: No mass, tenderness or fullness.         Left: No mass, tenderness or fullness.       Comments: Transformation zone seen. Pap smear collected. Musculoskeletal:     Right lower leg: No edema.     Left lower leg: No edema.     Comments: No major deformity or signs of synovitis appreciated.  Lymphadenopathy:     Cervical:  No cervical adenopathy.     Upper Body:     Right upper body: No supraclavicular or axillary adenopathy.     Left upper body: No supraclavicular or axillary adenopathy.  Skin:    General: Skin is warm.     Findings: No erythema or rash.     Comments: Scattered pinkish/slightly hyperpigmented, defined borders and oval lesions. Mainly on upper abdomen and UE's.  Neurological:     General: No focal deficit present.     Mental Status: She is alert and oriented to person, place, and time.     Cranial Nerves: No cranial nerve deficit.     Coordination: Coordination normal.     Gait: Gait normal.     Deep Tendon Reflexes:     Reflex Scores:      Bicep reflexes are 2+ on the right side and 2+ on the left side.      Patellar reflexes are 2+ on the right side and 2+ on the left side. Psychiatric:        Mood and Affect: Affect normal. Mood is anxious.   ASSESSMENT AND PLAN:  Ms. Dilara Navarrete was here today annual physical examination.  Orders Placed This Encounter  Procedures   Varicella-zoster vaccine IM   Comprehensive metabolic panel   Lipid panel   Hemoglobin A1c   VITAMIN D 25 Hydroxy (Vit-D Deficiency, Fractures)   Ambulatory referral to Gastroenterology   Lab Results  Component Value Date   CHOL 215 (H) 05/05/2022   HDL 58.00 05/05/2022   LDLCALC 123 (H) 05/05/2022   LDLDIRECT 117.0 02/11/2021   TRIG 169.0 (H) 05/05/2022   CHOLHDL 4 05/05/2022   Lab Results  Component Value Date   HGBA1C 5.2 05/05/2022   Lab Results  Component Value Date   ALT 11 05/05/2022   AST 20  05/05/2022   ALKPHOS 59 05/05/2022   BILITOT 0.6 05/05/2022   Lab Results  Component Value Date   CREATININE 0.68 05/05/2022   BUN 17 05/05/2022   NA 138 05/05/2022   K 3.6 05/05/2022   CL 103 05/05/2022   CO2 27 05/05/2022   Routine general medical examination at a health care facility We discussed the importance of regular physical activity and healthy diet for prevention of chronic illness  and/or complications. Preventive guidelines reviewed. Vaccination updated. Ca++ and vit D supplementation recommended. Next CPE in a year.  The 10-year ASCVD risk score (Arnett DK, et al., 2019) is: 1%   Values used to calculate the score:     Age: 72 years     Sex: Female     Is Non-Hispanic African American: No     Diabetic: No     Tobacco smoker: No     Systolic Blood Pressure: 110 mmHg     Is BP treated: No     HDL Cholesterol: 58 mg/dL     Total Cholesterol: 215 mg/dL  Vitamin D deficiency Continue same dose of vit D supplementation. Further recommendations according to 25 OH vit D result.  Hypertriglyceridemia Non pharmacologic treatment recommended for now. Further recommendations will be given according to 10 years CVD risk score and lipid panel numbers.  RUQ abdominal pain Chronic. We discussed possible causes: ? Muscular,radicular among some. Hx and findings on examination/work up do not suggest a serious process. She is very concerned about this problem, agrees with GI consultation.  Screening for endocrine, metabolic and immunity disorder -     Hemoglobin A1c  Cervical cancer screening -     Cytology - PAP ([place])  Seborrheic keratoses Educated about Dx and prognosis. Reassured. Continue monitoring for changes. Derma referral offered, she agrees with holding on this for now.  Need for shingles vaccine -     Varicella-zoster vaccine IM  Return in 1 year (on 05/06/2023) for CPE.  Kearia Yin G. [place], MD  [facility]. [place] office.

## 2022-05-05 ENCOUNTER — Encounter: Payer: Self-pay | Admitting: Family Medicine

## 2022-05-05 ENCOUNTER — Other Ambulatory Visit (HOSPITAL_COMMUNITY)
Admission: RE | Admit: 2022-05-05 | Discharge: 2022-05-05 | Disposition: A | Discharge status: Home / Self Care | Payer: Managed Care, Other (non HMO) | Source: Ambulatory Visit | Attending: Family Medicine | Admitting: Family Medicine

## 2022-05-05 ENCOUNTER — Ambulatory Visit (INDEPENDENT_AMBULATORY_CARE_PROVIDER_SITE_OTHER): Payer: Managed Care, Other (non HMO) | Admitting: Family Medicine

## 2022-05-05 VITALS — BP 110/70 | HR 67 | Temp 98.1°F | Resp 12 | Ht 60.0 in | Wt 123.0 lb

## 2022-05-05 DIAGNOSIS — Z124 Encounter for screening for malignant neoplasm of cervix: Secondary | ICD-10-CM

## 2022-05-05 DIAGNOSIS — R1011 Right upper quadrant pain: Secondary | ICD-10-CM

## 2022-05-05 DIAGNOSIS — Z23 Encounter for immunization: Secondary | ICD-10-CM | POA: Diagnosis not present

## 2022-05-05 DIAGNOSIS — E559 Vitamin D deficiency, unspecified: Secondary | ICD-10-CM | POA: Diagnosis not present

## 2022-05-05 DIAGNOSIS — Z130 Encounter for screening for diseases of the blood and blood-forming organs and certain disorders involving the immune mechanism: Secondary | ICD-10-CM

## 2022-05-05 DIAGNOSIS — E781 Pure hyperglyceridemia: Secondary | ICD-10-CM | POA: Diagnosis not present

## 2022-05-05 DIAGNOSIS — L821 Other seborrheic keratosis: Secondary | ICD-10-CM | POA: Diagnosis not present

## 2022-05-05 DIAGNOSIS — Z0000 Encounter for general adult medical examination without abnormal findings: Secondary | ICD-10-CM | POA: Diagnosis not present

## 2022-05-05 DIAGNOSIS — Z1329 Encounter for screening for other suspected endocrine disorder: Secondary | ICD-10-CM

## 2022-05-05 DIAGNOSIS — Z13228 Encounter for screening for other metabolic disorders: Secondary | ICD-10-CM | POA: Diagnosis not present

## 2022-05-05 LAB — COMPREHENSIVE METABOLIC PANEL
ALT: 11 U/L (ref 0–35)
AST: 20 U/L (ref 0–37)
Albumin: 4.4 g/dL (ref 3.5–5.2)
Alkaline Phosphatase: 59 U/L (ref 39–117)
BUN: 17 mg/dL (ref 6–23)
CO2: 27 mEq/L (ref 19–32)
Calcium: 9.5 mg/dL (ref 8.4–10.5)
Chloride: 103 mEq/L (ref 96–112)
Creatinine, Ser: 0.68 mg/dL (ref 0.40–1.20)
GFR: 101.81 mL/min (ref >60.00)
Glucose, Bld: 81 mg/dL (ref 70–99)
Potassium: 3.6 mEq/L (ref 3.5–5.1)
Sodium: 138 mEq/L (ref 135–145)
Total Bilirubin: 0.6 mg/dL (ref 0.2–1.2)
Total Protein: 7.3 g/dL (ref 6.0–8.3)

## 2022-05-05 LAB — LIPID PANEL
Cholesterol: 215 mg/dL — ABNORMAL HIGH (ref 0–200)
HDL: 58 mg/dL (ref >39.00)
LDL Cholesterol: 123 mg/dL — ABNORMAL HIGH (ref 0–99)
NonHDL: 157.21
Total CHOL/HDL Ratio: 4
Triglycerides: 169 mg/dL — ABNORMAL HIGH (ref 0.0–149.0)
VLDL: 33.8 mg/dL (ref 0.0–40.0)

## 2022-05-05 LAB — VITAMIN D 25 HYDROXY (VIT D DEFICIENCY, FRACTURES): VITD: 28.73 ng/mL — ABNORMAL LOW (ref 30.00–100.00)

## 2022-05-05 LAB — HEMOGLOBIN A1C: Hgb A1c MFr Bld: 5.2 % (ref 4.6–6.5)

## 2022-05-05 NOTE — Patient Instructions (Addendum)
A few things to remember from today's visit:  Routine general medical examination at a health care facility  Vitamin D deficiency - Plan: VITAMIN D 25 Hydroxy (Vit-D Deficiency, Fractures)  Hypertriglyceridemia - Plan: Lipid panel  RUQ abdominal pain - Plan: Ambulatory referral to Gastroenterology, Comprehensive metabolic panel  Screening for endocrine, metabolic and immunity disorder - Plan: Hemoglobin A1c  Cervical cancer screening - Plan: Cytology - PAP ([place])  Seborrheic keratoses  If you need refills for medications you take chronically, please call your pharmacy. Do not use My Chart to request refills or for acute issues that need immediate attention. If you send a my chart message, it may take a few days to be addressed, specially if I am not in the office.  Please be sure medication list is accurate. If a new problem present, please set up appointment sooner than planned today.  Calcium from 1000 to 1200 mg daily, ideally through the diet. Vitamin D 800 units daily. Skin lesions seem seborrheic keratosis. Monitor for changes. Appt with gastroenterologist will be arranged to talk about abdominal pain.  Health Maintenance, Female Adopting a healthy lifestyle and getting preventive care are important in promoting health and wellness. Ask your health care provider about: The right schedule for you to have regular tests and exams. Things you can do on your own to prevent diseases and keep yourself healthy. What should I know about diet, weight, and exercise? Eat a healthy diet  Eat a diet that includes plenty of vegetables, fruits, low-fat dairy products, and lean protein. Do not eat a lot of foods that are high in solid fats, added sugars, or sodium. Maintain a healthy weight Body mass index (BMI) is used to identify weight problems. It estimates body fat based on height and weight. Your health care provider can help determine your BMI and help you achieve or maintain  a healthy weight. Get regular exercise Get regular exercise. This is one of the most important things you can do for your health. Most adults should: Exercise for at least 150 minutes each week. The exercise should increase your heart rate and make you sweat (moderate-intensity exercise). Do strengthening exercises at least twice a week. This is in addition to the moderate-intensity exercise. Spend less time sitting. Even light physical activity can be beneficial. Watch cholesterol and blood lipids Have your blood tested for lipids and cholesterol at 50 years of age, then have this test every 5 years. Have your cholesterol levels checked more often if: Your lipid or cholesterol levels are high. You are older than 50 years of age. You are at high risk for heart disease. What should I know about cancer screening? Depending on your health history and family history, you may need to have cancer screening at various ages. This may include screening for: Breast cancer. Cervical cancer. Colorectal cancer. Skin cancer. Lung cancer. What should I know about heart disease, diabetes, and high blood pressure? Blood pressure and heart disease High blood pressure causes heart disease and increases the risk of stroke. This is more likely to develop in people who have high blood pressure readings or are overweight. Have your blood pressure checked: Every 3-5 years if you are 69-57 years of age. Every year if you are 72 years old or older. Diabetes Have regular diabetes screenings. This checks your fasting blood sugar level. Have the screening done: Once every three years after age 14 if you are at a normal weight and have a low risk for diabetes. More  often and at a younger age if you are overweight or have a high risk for diabetes. What should I know about preventing infection? Hepatitis B If you have a higher risk for hepatitis B, you should be screened for this virus. Talk with your health care  provider to find out if you are at risk for hepatitis B infection. Hepatitis C Testing is recommended for: Everyone born from [birth year] through 1964. Anyone with known risk factors for hepatitis C. Sexually transmitted infections (STIs) Get screened for STIs, including gonorrhea and chlamydia, if: You are sexually active and are younger than 50 years of age. You are older than 51 years of age and your health care provider tells you that you are at risk for this type of infection. Your sexual activity has changed since you were last screened, and you are at increased risk for chlamydia or gonorrhea. Ask your health care provider if you are at risk. Ask your health care provider about whether you are at high risk for HIV. Your health care provider may recommend a prescription medicine to help prevent HIV infection. If you choose to take medicine to prevent HIV, you should first get tested for HIV. You should then be tested every 3 months for as long as you are taking the medicine. Pregnancy If you are about to stop having your period (premenopausal) and you may become pregnant, seek counseling before you get pregnant. Take 400 to 800 micrograms (mcg) of folic acid every day if you become pregnant. Ask for birth control (contraception) if you want to prevent pregnancy. Osteoporosis and menopause Osteoporosis is a disease in which the bones lose minerals and strength with aging. This can result in bone fractures. If you are 59 years old or older, or if you are at risk for osteoporosis and fractures, ask your health care provider if you should: Be screened for bone loss. Take a calcium or vitamin D supplement to lower your risk of fractures. Be given hormone replacement therapy (HRT) to treat symptoms of menopause. Follow these instructions at home: Alcohol use Do not drink alcohol if: Your health care provider tells you not to drink. You are pregnant, may be pregnant, or are planning to become  pregnant. If you drink alcohol: Limit how much you have to: 0-1 drink a day. Know how much alcohol is in your drink. In the [place] one drink equals one 12 oz bottle of beer (355 mL), one 5 oz glass of wine (148 mL), or one 1 oz glass of hard liquor (44 mL). Lifestyle Do not use any products that contain nicotine or tobacco. These products include cigarettes, chewing tobacco, and vaping devices, such as e-cigarettes. If you need help quitting, ask your health care provider. Do not use street drugs. Do not share needles. Ask your health care provider for help if you need support or information about quitting drugs. General instructions Schedule regular health, dental, and eye exams. Stay current with your vaccines. Tell your health care provider if: You often feel depressed. You have ever been abused or do not feel safe at home. Summary Adopting a healthy lifestyle and getting preventive care are important in promoting health and wellness. Follow your health care provider's instructions about healthy diet, exercising, and getting tested or screened for diseases. Follow your health care provider's instructions on monitoring your cholesterol and blood pressure. This information is not intended to replace advice given to you by your health care provider. Make sure you discuss any questions you have  with your health care provider. Document Revised: 12/29/2020 Document Reviewed: 12/29/2020 Elsevier Patient Education  2023 [employer].

## 2022-05-12 LAB — CYTOLOGY - PAP
Comment: NEGATIVE
Diagnosis: NEGATIVE
Diagnosis: REACTIVE
High risk HPV: NEGATIVE

## 2022-05-17 ENCOUNTER — Other Ambulatory Visit: Payer: Self-pay | Admitting: Family Medicine

## 2022-05-17 ENCOUNTER — Encounter: Payer: Self-pay | Admitting: Family Medicine

## 2022-05-17 DIAGNOSIS — Z1231 Encounter for screening mammogram for malignant neoplasm of breast: Secondary | ICD-10-CM

## 2022-05-18 NOTE — Telephone Encounter (Signed)
See other mychart encounter.

## 2022-06-25 ENCOUNTER — Ambulatory Visit
Admission: RE | Admit: 2022-06-25 | Discharge: 2022-06-25 | Disposition: A | Discharge status: Home / Self Care | Payer: Managed Care, Other (non HMO) | Source: Ambulatory Visit | Attending: Family Medicine | Admitting: Family Medicine

## 2022-06-25 DIAGNOSIS — Z1231 Encounter for screening mammogram for malignant neoplasm of breast: Secondary | ICD-10-CM

## 2023-01-30 IMAGING — MG MM DIGITAL SCREENING BILAT W/ TOMO AND CAD
8 series · 9 of 24 positions shown · non-contrast
Comparison: Previous exam(s).

CLINICAL DATA: Screening.

EXAM:
DIGITAL SCREENING BILATERAL MAMMOGRAM WITH TOMOSYNTHESIS AND CAD
TECHNIQUE: Bilateral screening digital craniocaudal and mediolateral oblique
mammograms were obtained. Bilateral screening digital breast
tomosynthesis was performed. The images were evaluated with
computer-aided detection.

[L CC synth-2D]
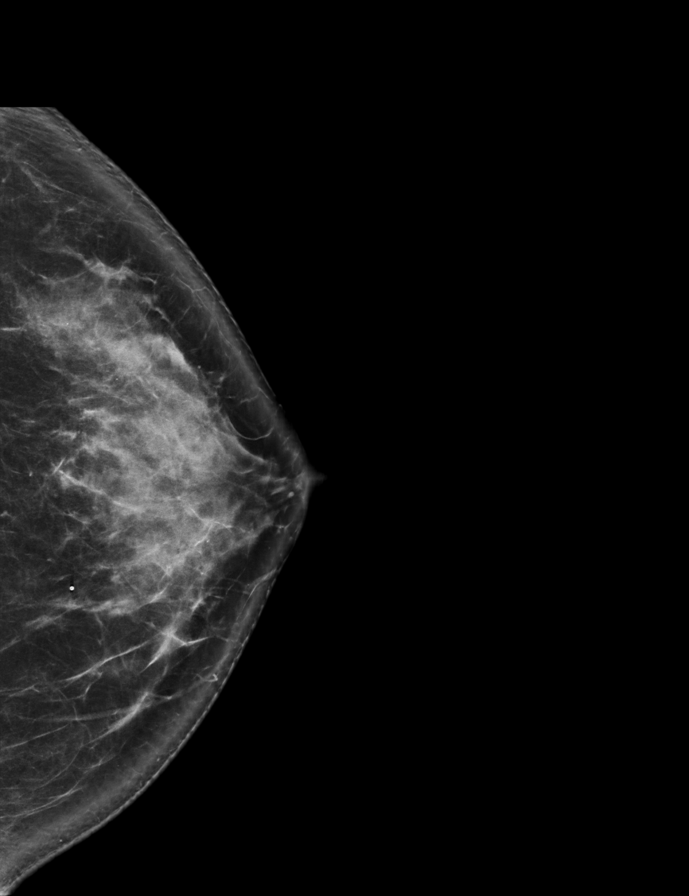

[R MLO synth-2D]
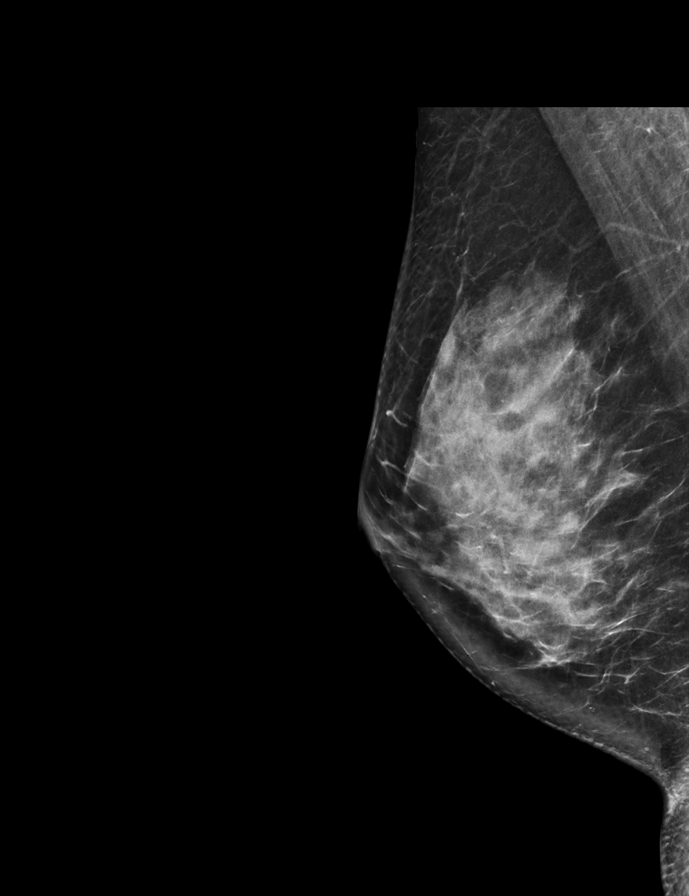

[R CC synth-2D]
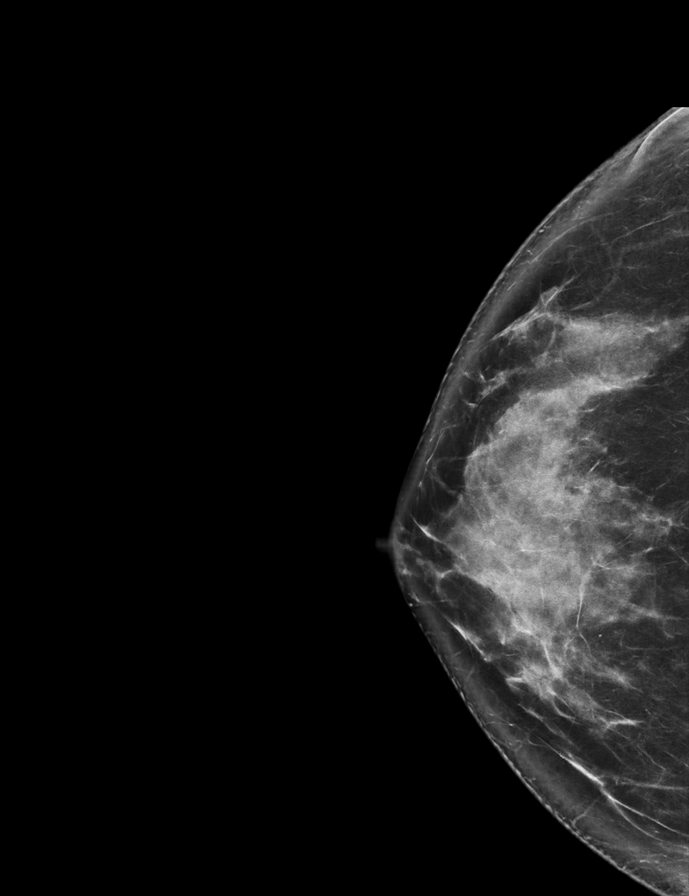

[L MLO synth-2D]
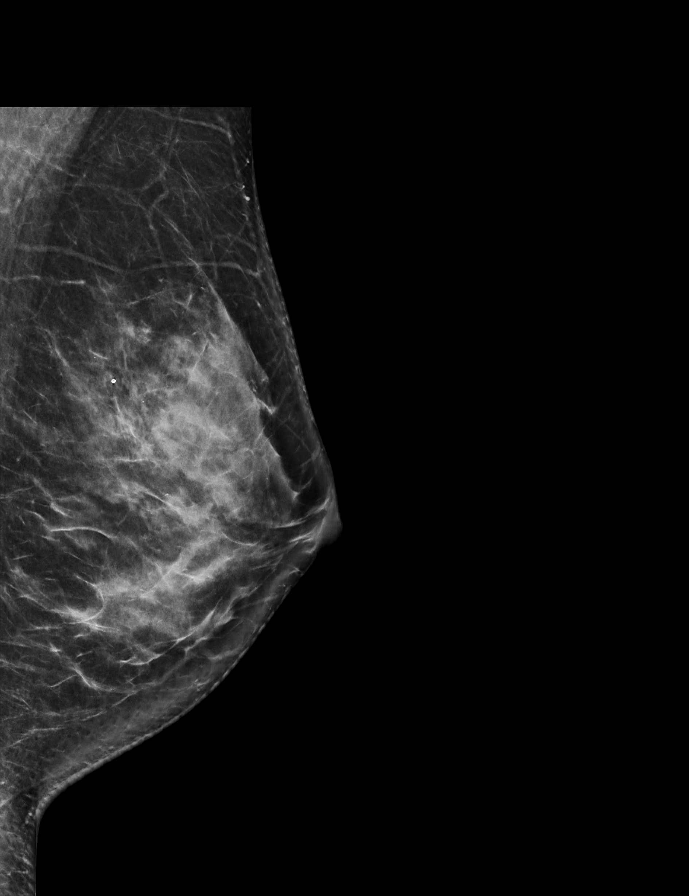

[R MLO tomo · 2 of 79 frames shown]
[frame 26/79]
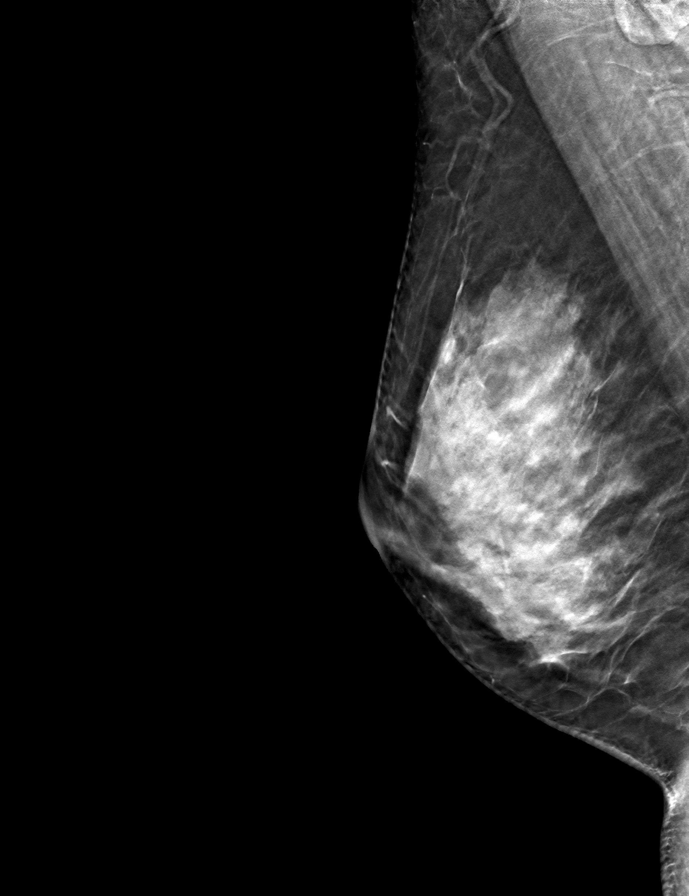
[frame 40/79]
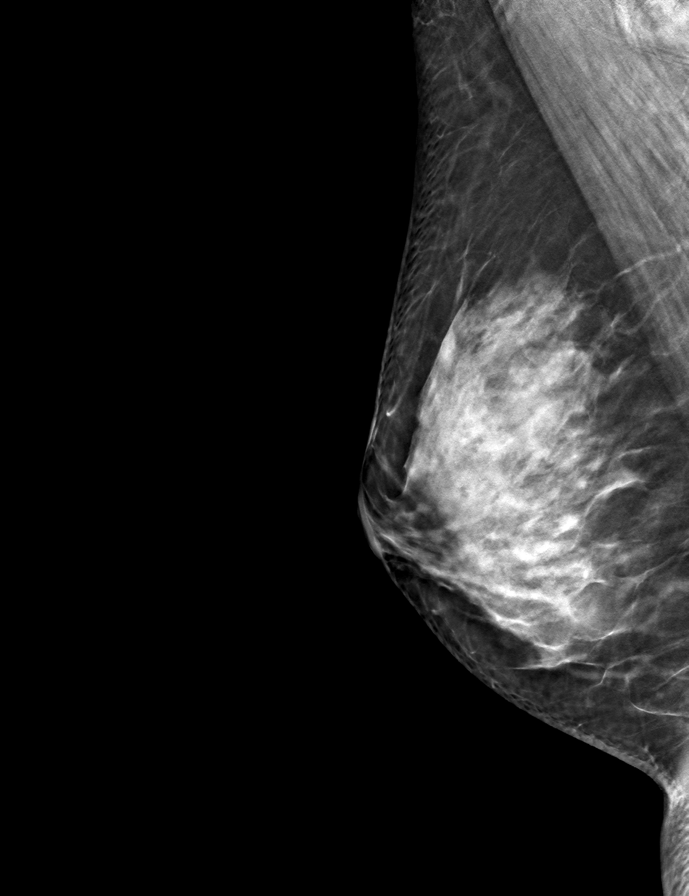

[R CC tomo · tomo slice 43/84.0]
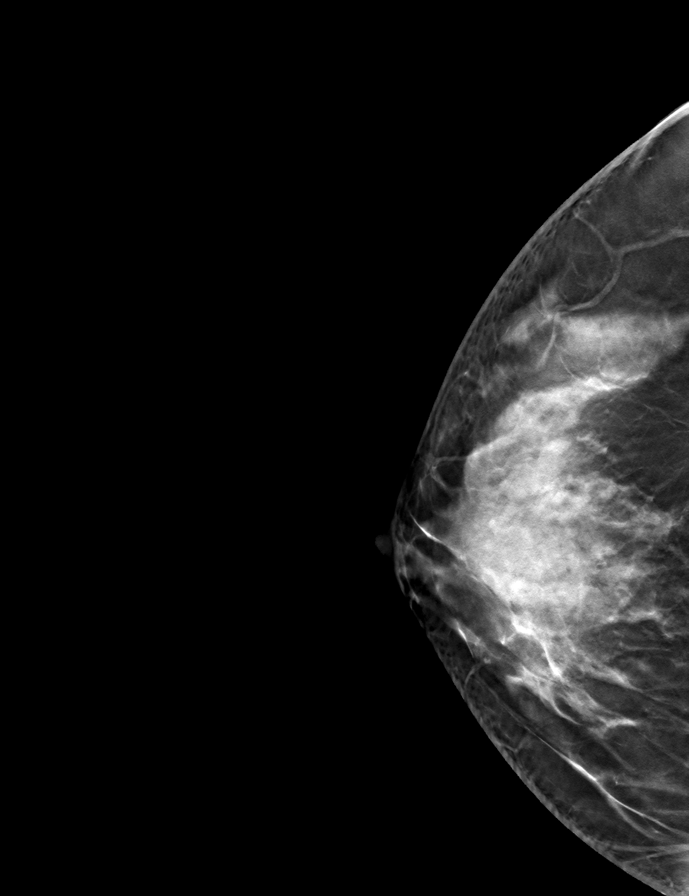

[L CC tomo · tomo slice 39/77.0]
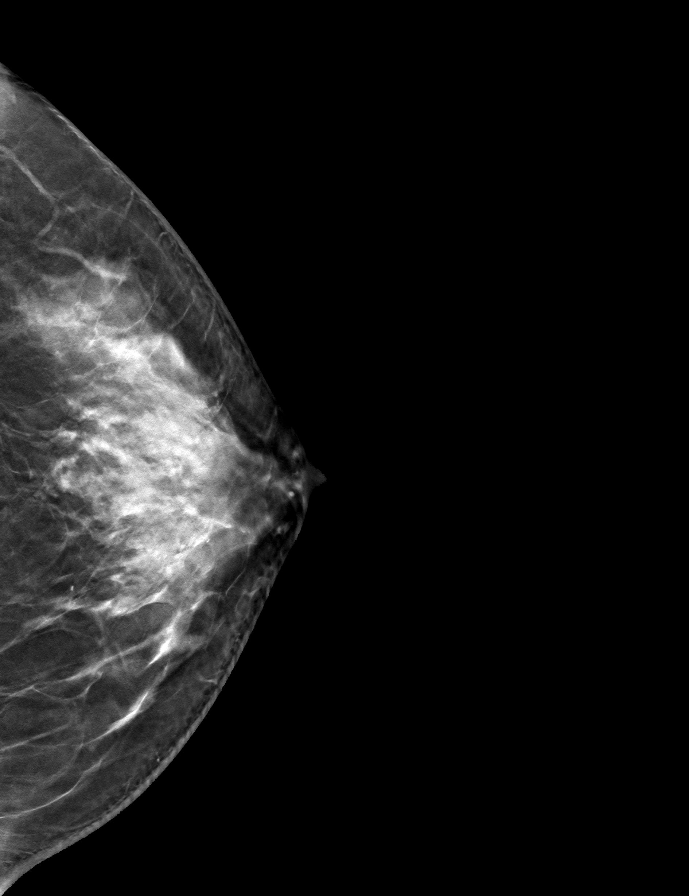

[L MLO tomo · tomo slice 38/75.0]
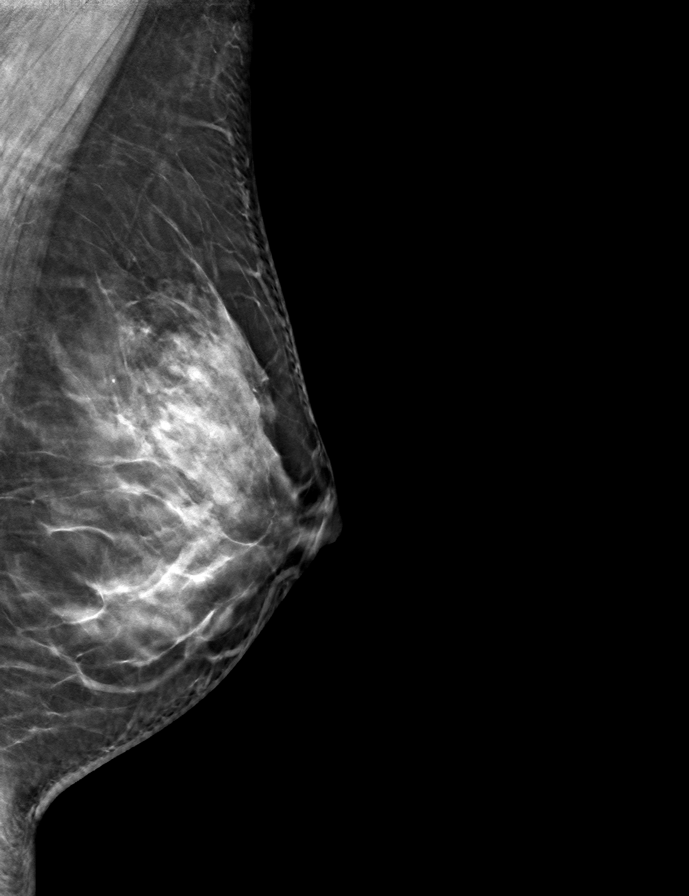

[9 of 24 positions shown; findings below may reference images not displayed]

ACR Breast Density Category d: The breast tissue is extremely dense,
which lowers the sensitivity of mammography
FINDINGS: There are no findings suspicious for malignancy.
IMPRESSION: No mammographic evidence of malignancy. A result letter of this
screening mammogram will be mailed directly to the patient.

RECOMMENDATION:
Screening mammogram in one year. (Code:[id number])

BI-RADS CATEGORY  1: Negative.

## 2023-05-24 ENCOUNTER — Other Ambulatory Visit: Payer: Self-pay | Admitting: Family Medicine

## 2023-05-24 ENCOUNTER — Encounter: Payer: Self-pay | Admitting: Family Medicine

## 2023-05-24 ENCOUNTER — Ambulatory Visit (INDEPENDENT_AMBULATORY_CARE_PROVIDER_SITE_OTHER): Payer: Managed Care, Other (non HMO) | Admitting: Family Medicine

## 2023-05-24 VITALS — BP 110/70 | HR 70 | Temp 98.5°F | Resp 12 | Ht 60.0 in | Wt 122.0 lb

## 2023-05-24 DIAGNOSIS — Z130 Encounter for screening for diseases of the blood and blood-forming organs and certain disorders involving the immune mechanism: Secondary | ICD-10-CM

## 2023-05-24 DIAGNOSIS — Z0000 Encounter for general adult medical examination without abnormal findings: Secondary | ICD-10-CM | POA: Insufficient documentation

## 2023-05-24 DIAGNOSIS — R1011 Right upper quadrant pain: Secondary | ICD-10-CM | POA: Diagnosis not present

## 2023-05-24 DIAGNOSIS — E559 Vitamin D deficiency, unspecified: Secondary | ICD-10-CM | POA: Diagnosis not present

## 2023-05-24 DIAGNOSIS — Z23 Encounter for immunization: Secondary | ICD-10-CM | POA: Diagnosis not present

## 2023-05-24 DIAGNOSIS — R232 Flushing: Secondary | ICD-10-CM | POA: Insufficient documentation

## 2023-05-24 DIAGNOSIS — Z1329 Encounter for screening for other suspected endocrine disorder: Secondary | ICD-10-CM | POA: Diagnosis not present

## 2023-05-24 DIAGNOSIS — Z13228 Encounter for screening for other metabolic disorders: Secondary | ICD-10-CM | POA: Diagnosis not present

## 2023-05-24 DIAGNOSIS — E785 Hyperlipidemia, unspecified: Secondary | ICD-10-CM

## 2023-05-24 DIAGNOSIS — Z1231 Encounter for screening mammogram for malignant neoplasm of breast: Secondary | ICD-10-CM

## 2023-05-24 LAB — CBC
HCT: 41.1 % (ref 36.0–46.0)
Hemoglobin: 13.4 g/dL (ref 12.0–15.0)
MCHC: 32.7 g/dL (ref 30.0–36.0)
MCV: 94.4 fL (ref 78.0–100.0)
Platelets: 182 10*3/uL (ref 150.0–400.0)
RBC: 4.36 Mil/uL (ref 3.87–5.11)
RDW: 12.3 % (ref 11.5–15.5)
WBC: 3.3 10*3/uL — ABNORMAL LOW (ref 4.0–10.5)

## 2023-05-24 LAB — TSH: TSH: 1.57 u[IU]/mL (ref 0.35–5.50)

## 2023-05-24 LAB — COMPREHENSIVE METABOLIC PANEL
ALT: 12 U/L (ref 0–35)
AST: 18 U/L (ref 0–37)
Albumin: 4.2 g/dL (ref 3.5–5.2)
Alkaline Phosphatase: 67 U/L (ref 39–117)
BUN: 11 mg/dL (ref 6–23)
CO2: 26 meq/L (ref 19–32)
Calcium: 9.4 mg/dL (ref 8.4–10.5)
Chloride: 105 meq/L (ref 96–112)
Creatinine, Ser: 0.59 mg/dL (ref 0.40–1.20)
GFR: 104.58 mL/min (ref >60.00)
Glucose, Bld: 86 mg/dL (ref 70–99)
Potassium: 3.7 meq/L (ref 3.5–5.1)
Sodium: 140 meq/L (ref 135–145)
Total Bilirubin: 0.6 mg/dL (ref 0.2–1.2)
Total Protein: 6.8 g/dL (ref 6.0–8.3)

## 2023-05-24 LAB — LIPID PANEL
Cholesterol: 184 mg/dL (ref 0–200)
HDL: 53 mg/dL (ref >39.00)
LDL Cholesterol: 73 mg/dL (ref 0–99)
NonHDL: 130.78
Total CHOL/HDL Ratio: 3
Triglycerides: 287 mg/dL — ABNORMAL HIGH (ref 0.0–149.0)
VLDL: 57.4 mg/dL — ABNORMAL HIGH (ref 0.0–40.0)

## 2023-05-24 NOTE — Assessment & Plan Note (Signed)
Continue nonpharmacologic treatment. Further recommendation will be given according to lipid panel results and 10-year CVD risk score.

## 2023-05-24 NOTE — Assessment & Plan Note (Signed)
Most likely related to hormonal changes, perimenopause. We discussed differential diagnosis. We reviewed nonhormonal therapies, she is not interested in pharmacologic treatment.

## 2023-05-24 NOTE — Progress Notes (Signed)
HPI: [name] is a 52 y.o. female with a PMHx significant for vitamin D deficiency, B12 deficiency, IBS, GERD, and migraines, who is here today for her routine physical.  Last CPE: 05/05/2022  Exercise: She has been stretching most days.  Diet: She cooks at home and eats vegetables most days. She eats a combination of chicken, fish, and red meat. She snacks on carbohydrates like chips and bread. Sleep: She sleeps an average of 5 hours/night.  Alcohol Use: She doesn't drink.  Smoking: Never Vision: She is UTD on routine vision care. Dental: She is UTD on routine dental care.   Immunization History  Administered Date(s) Administered   Zoster Recombinant(Shingrix) 05/05/2022   Health Maintenance  Topic Date Due   DTaP/Tdap/Td (1 - Tdap) Never done   Zoster Vaccines- Shingrix (2 of 2) 06/30/2022   INFLUENZA VACCINE  Never done   COVID-19 Vaccine (1) 06/09/2023 (Originally 04/15/1976)   HIV Screening  02/18/2024 (Originally 04/16/1986)   MAMMOGRAM  06/25/2024   Cervical Cancer Screening (HPV/Pap Cotest)  05/06/2027   Colonoscopy  09/11/2031   Hepatitis C Screening  Completed   HPV VACCINES  Aged Out   Concerns today:   RUQ abdominal pain, which has been going on since 2021. Pain is not radiated, initially was sharp and now is dull, intermittent. It seems to be exacerbated by food intake. RUQ abdomen US in 09/2020 showed scattered hepatic cyst stable in appearance from the prior exam.  Mild fatty infiltration of the liver. Lab Results  Component Value Date   ALT 11 05/05/2022   AST 20 05/05/2022   ALKPHOS 59 05/05/2022   BILITOT 0.6 05/05/2022   Hot flashes: Started a few months ago, interfering with sleep. Heat coming from upper chest to face and associated sweating. She has not identified exacerbating or alleviating factors. History of constipation, reporting regular bowel movement, no melena or blood in stool.  LMP 09/2022.  Vitamin D deficiency: She has not been  consistent with taking vitamin D supplementation daily.  Hyperlipidemia: Currently she is on nonpharmacologic treatment. Lab Results  Component Value Date   CHOL 215 (H) 05/05/2022   HDL 58.00 05/05/2022   LDLCALC 123 (H) 05/05/2022   LDLDIRECT 117.0 02/11/2021   TRIG 169.0 (H) 05/05/2022   CHOLHDL 4 05/05/2022   Review of Systems  Constitutional:  Negative for activity change, appetite change and fever.  HENT:  Negative for hearing loss, mouth sores, sore throat and trouble swallowing.   Eyes:  Negative for redness and visual disturbance.  Respiratory:  Negative for cough, shortness of breath and wheezing.   Cardiovascular:  Negative for chest pain and leg swelling.  Gastrointestinal:  Positive for abdominal pain. Negative for abdominal distention, nausea and vomiting.       No changes in bowel habits.  Endocrine: Negative for cold intolerance, heat intolerance, polydipsia, polyphagia and polyuria.  Genitourinary:  Negative for decreased urine volume, dysuria, hematuria, vaginal bleeding and vaginal discharge.  Musculoskeletal:  Negative for gait problem and myalgias.  Skin:  Negative for color change and rash.  Allergic/Immunologic: Negative for environmental allergies.  Neurological:  Negative for seizures, syncope, weakness and headaches.  Hematological:  Negative for adenopathy. Does not bruise/bleed easily.  Psychiatric/Behavioral:  Negative for confusion. The patient is not nervous/anxious.   All other systems reviewed and are negative.  Current Outpatient Medications on File Prior to Visit  Medication Sig Dispense Refill   triamcinolone cream (KENALOG) 0.1 % Apply 1 application topically daily as needed. Hands 30  g 1   No current facility-administered medications on file prior to visit.   Past Medical History:  Diagnosis Date   Anemia    Constipation    GERD (gastroesophageal reflux disease)    Migraine headache    Past Surgical History:  Procedure Laterality Date    TUBAL LIGATION     No Known Allergies  Family History  Problem Relation Age of Onset   Hypertension Mother    Kidney disease Father    Cancer Neg Hx    Colon cancer Neg Hx    Colon polyps Neg Hx    Esophageal cancer Neg Hx    Rectal cancer Neg Hx    Stomach cancer Neg Hx     Social History   Socioeconomic History   Marital status: Married    Spouse name: Not on file   Number of children: Not on file   Years of education: Not on file   Highest education level: Not on file  Occupational History   Not on file  Tobacco Use   Smoking status: Never   Smokeless tobacco: Never  Substance and Sexual Activity   Alcohol use: No   Drug use: No   Sexual activity: Yes    Birth control/protection: Surgical  Other Topics Concern   Not on file  Social History Narrative   Marital Status:  Married    Children:  G1 P1001 (Son - Manufacturing systems engineer)    Pets:  None    Living Situation: Lives with husband.   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  Wt Readings from Last 3 Encounters:  05/24/23 122 lb (55.3 kg)  05/05/22 123 lb (55.8 kg)  09/10/21 123 lb (55.8 kg)   Physical Exam Vitals and nursing note reviewed.  Constitutional:      General: She is not in acute distress.    Appearance: She is well-developed.  HENT:     Head: Normocephalic and atraumatic.     Right Ear: Hearing, tympanic membrane, ear canal and  external ear normal.     Left Ear: Hearing, tympanic membrane, ear canal and external ear normal.     Mouth/Throat:     Mouth: Mucous membranes are moist.     Pharynx: Oropharynx is clear. Uvula midline.  Eyes:     Extraocular Movements: Extraocular movements intact.     Conjunctiva/sclera: Conjunctivae normal.     Pupils: Pupils are equal, round, and reactive to light.  Neck:     Thyroid: No thyroid mass or thyromegaly.  Cardiovascular:     Rate and Rhythm: Normal rate and regular rhythm.     Pulses:          Dorsalis pedis pulses are 2+ on the right  side and 2+ on the left side.     Heart sounds: No murmur heard. Pulmonary:     Effort: Pulmonary effort is normal. No respiratory distress.     Breath sounds: Normal breath sounds.  Abdominal:     Palpations: Abdomen is soft. There is no hepatomegaly or mass.     Tenderness: There is no abdominal tenderness.  Genitourinary:    Comments: No concerns. Musculoskeletal:     Right lower leg: No edema.     Left lower leg: No edema.     Comments: No major deformity or signs of synovitis appreciated.  Lymphadenopathy:     Cervical: No cervical adenopathy.     Upper Body:     Right upper body: No supraclavicular adenopathy.     Left upper body: No supraclavicular adenopathy.  Skin:    General: Skin is warm.     Findings: No erythema or rash.  Neurological:     General: No focal deficit present.     Mental Status: She is alert and oriented to person, place, and time.     Cranial Nerves: No cranial nerve deficit.     Coordination: Coordination normal.     Gait: Gait normal.     Deep Tendon Reflexes:     Reflex Scores:      Bicep reflexes are 2+ on the right side and 2+ on the left side.      Patellar reflexes are 2+ on the right side and 2+ on the left side. Psychiatric:        Mood and Affect: Mood and affect normal.   ASSESSMENT AND PLAN:  Ms. Katelyn Jenkins was here today for her annual physical examination.  Orders Placed  This Encounter  Procedures   Helicobacter Pylori Special Antigen, Stool   Comprehensive metabolic panel   CBC   TSH   Lipid panel   Urinalysis with Culture Reflex   Lab Results  Component Value Date   CHOL 184 05/24/2023   HDL 53.00 05/24/2023   LDLCALC 73 05/24/2023   LDLDIRECT 117.0 02/11/2021   TRIG 287.0 (H) 05/24/2023   CHOLHDL 3 05/24/2023   Lab Results  Component Value Date   TSH 1.57 05/24/2023   Lab Results  Component Value Date   NA 140 05/24/2023   CL 105 05/24/2023   K 3.7 05/24/2023   CO2 26 05/24/2023   BUN 11 05/24/2023   CREATININE 0.59 05/24/2023   GFR 104.58 05/24/2023   CALCIUM 9.4 05/24/2023   ALBUMIN 4.2 05/24/2023   GLUCOSE 86 05/24/2023   Lab Results  Component Value Date   ALT 12 05/24/2023   AST 18 05/24/2023   ALKPHOS 67 05/24/2023   BILITOT 0.6 05/24/2023   Lab Results  Component Value Date   WBC 3.3 (L) 05/24/2023   HGB 13.4 05/24/2023   HCT 41.1 05/24/2023   MCV 94.4 05/24/2023   PLT 182.0 05/24/2023   Routine general medical examination at a health care facility Assessment & Plan: We discussed the importance of regular physical activity and healthy diet for prevention of chronic illness and/or complications. Preventive guidelines reviewed. Vaccination: Shingrix given today, declined flu vaccination. Ca++ and vit D supplementation recommended. Next CPE in a year.   Hyperlipidemia, unspecified hyperlipidemia type Assessment & Plan: Continue nonpharmacologic treatment. Further recommendation will be given according to lipid panel results and 10-year CVD risk score.  Orders: -     Lipid panel; Future  Vitamin D deficiency, unspecified Assessment & Plan: She has  not been consistent with taking daily vitamin D supplementation. Further recommendation will be given according to 25 [place] vitamin D result.   Screening for endocrine, metabolic and immunity disorder -     Comprehensive metabolic panel; Future  RUQ abdominal  pain Assessment & Plan: Chronic. We discussed possible etiologies. History and examination do not suggest a serious process. She had liver US in 04/2021 with no significant abnormalities except for mild fatty infiltration of the liver and stable scattered hepatic cysts.. Ordered abdomen CT, denied by her insurance. Today we will screen for H. pylori infection, if negative, will recommend GI evaluation.  Orders: -     Comprehensive metabolic panel; Future -     CBC; Future -     Urinalysis w microscopic + reflex cultur; Future -     Helicobacter Pylori Special Antigen, Stool; Future -     Helicobacter Pylori Special Antigen, Stool; Future  Hot flashes Assessment & Plan: Most likely related to hormonal changes, perimenopause. We discussed differential diagnosis. We reviewed nonhormonal therapies, she is not interested in pharmacologic treatment.  Orders: -     TSH; Future  Need for shingles vaccine -     Varicella-zoster vaccine IM   Return in 1 year (on 05/23/2024) for CPE, chronic problems.  I, Rolla Etienne Wierda, acting as a scribe for [facility] [place], MD., have documented all relevant documentation on the behalf of Shericka Johnstone [place], MD, as directed by  Marquet Faircloth [place], MD while in the presence of Katheren Jimmerson [place], MD.   I, Chandel Zaun [place], MD, have reviewed all documentation for this visit. The documentation on 05/24/23 for the exam, diagnosis, procedures, and orders are all accurate and complete.  Blondina Coderre G. [place], MD  [facility]. [place] office.

## 2023-05-24 NOTE — Patient Instructions (Addendum)
A few things to remember from today's visit:  Routine general medical examination at a health care facility  Hyperlipidemia, unspecified hyperlipidemia type - Plan: Lipid panel  Vitamin D deficiency, unspecified  Screening for endocrine, metabolic and immunity disorder  RUQ abdominal pain - Plan: Helicobacter Pylori Special Antigen, Stool, Comprehensive metabolic panel, CBC, Urinalysis with Culture Reflex  Hot flashes - Plan: TSH  KY can be used as vaginal lubricant for sex intercourse.   Do not use My Chart to request refills or for acute issues that need immediate attention. If you send a my chart message, it may take a few days to be addressed, specially if I am not in the office.  Please be sure medication list is accurate. If a new problem present, please set up appointment sooner than planned today.  Health Maintenance, Female Adopting a healthy lifestyle and getting preventive care are important in promoting health and wellness. Ask your health care provider about: The right schedule for you to have regular tests and exams. Things you can do on your own to prevent diseases and keep yourself healthy. What should I know about diet, weight, and exercise? Eat a healthy diet  Eat a diet that includes plenty of vegetables, fruits, low-fat dairy products, and lean protein. Do not eat a lot of foods that are high in solid fats, added sugars, or sodium. Maintain a healthy weight Body mass index (BMI) is used to identify weight problems. It estimates body fat based on height and weight. Your health care provider can help determine your BMI and help you achieve or maintain a healthy weight. Get regular exercise Get regular exercise. This is one of the most important things you can do for your health. Most adults should: Exercise for at least 150 minutes each week. The exercise should increase your heart rate and make you sweat (moderate-intensity exercise). Do strengthening exercises  at least twice a week. This is in addition to the moderate-intensity exercise. Spend less time sitting. Even light physical activity can be beneficial. Watch cholesterol and blood lipids Have your blood tested for lipids and cholesterol at 51 years of age, then have this test every 5 years. Have your cholesterol levels checked more often if: Your lipid or cholesterol levels are high. You are older than 51 years of age. You are at high risk for heart disease. What should I know about cancer screening? Depending on your health history and family history, you may need to have cancer screening at various ages. This may include screening for: Breast cancer. Cervical cancer. Colorectal cancer. Skin cancer. Lung cancer. What should I know about heart disease, diabetes, and high blood pressure? Blood pressure and heart disease High blood pressure causes heart disease and increases the risk of stroke. This is more likely to develop in people who have high blood pressure readings or are overweight. Have your blood pressure checked: Every 3-5 years if you are 20-12 years of age. Every year if you are 68 years old or older. Diabetes Have regular diabetes screenings. This checks your fasting blood sugar level. Have the screening done: Once every three years after age 43 if you are at a normal weight and have a low risk for diabetes. More often and at a younger age if you are overweight or have a high risk for diabetes. What should I know about preventing infection? Hepatitis B If you have a higher risk for hepatitis B, you should be screened for this virus. Talk with your health care  provider to find out if you are at risk for hepatitis B infection. Hepatitis C Testing is recommended for: Everyone born from [birth year] through 1964. Anyone with known risk factors for hepatitis C. Sexually transmitted infections (STIs) Get screened for STIs, including gonorrhea and chlamydia, if: You are sexually active  and are younger than 51 years of age. You are older than 51 years of age and your health care provider tells you that you are at risk for this type of infection. Your sexual activity has changed since you were last screened, and you are at increased risk for chlamydia or gonorrhea. Ask your health care provider if you are at risk. Ask your health care provider about whether you are at high risk for HIV. Your health care provider may recommend a prescription medicine to help prevent HIV infection. If you choose to take medicine to prevent HIV, you should first get tested for HIV. You should then be tested every 3 months for as long as you are taking the medicine. Pregnancy If you are about to stop having your period (premenopausal) and you may become pregnant, seek counseling before you get pregnant. Take 400 to 800 micrograms (mcg) of folic acid every day if you become pregnant. Ask for birth control (contraception) if you want to prevent pregnancy. Osteoporosis and menopause Osteoporosis is a disease in which the bones lose minerals and strength with aging. This can result in bone fractures. If you are 61 years old or older, or if you are at risk for osteoporosis and fractures, ask your health care provider if you should: Be screened for bone loss. Take a calcium or vitamin D supplement to lower your risk of fractures. Be given hormone replacement therapy (HRT) to treat symptoms of menopause. Follow these instructions at home: Alcohol use Do not drink alcohol if: Your health care provider tells you not to drink. You are pregnant, may be pregnant, or are planning to become pregnant. If you drink alcohol: Limit how much you have to: 0-1 drink a day. Know how much alcohol is in your drink. In the [place] one drink equals one 12 oz bottle of beer (355 mL), one 5 oz glass of wine (148 mL), or one 1 oz glass of hard liquor (44 mL). Lifestyle Do not use any products that contain nicotine or tobacco.  These products include cigarettes, chewing tobacco, and vaping devices, such as e-cigarettes. If you need help quitting, ask your health care provider. Do not use street drugs. Do not share needles. Ask your health care provider for help if you need support or information about quitting drugs. General instructions Schedule regular health, dental, and eye exams. Stay current with your vaccines. Tell your health care provider if: You often feel depressed. You have ever been abused or do not feel safe at home. Summary Adopting a healthy lifestyle and getting preventive care are important in promoting health and wellness. Follow your health care provider's instructions about healthy diet, exercising, and getting tested or screened for diseases. Follow your health care provider's instructions on monitoring your cholesterol and blood pressure. This information is not intended to replace advice given to you by your health care provider. Make sure you discuss any questions you have with your health care provider. Document Revised: 12/29/2020 Document Reviewed: 12/29/2020 Elsevier Patient Education  2024 [employer].

## 2023-05-24 NOTE — Assessment & Plan Note (Signed)
She has not been consistent with taking daily vitamin D supplementation. Further recommendation will be given according to 25 OH vitamin D result.

## 2023-05-24 NOTE — Assessment & Plan Note (Signed)
Chronic. We discussed possible etiologies. History and examination do not suggest a serious process. She had liver US in 04/2021 with no significant abnormalities except for mild fatty infiltration of the liver and stable scattered hepatic cysts.. Ordered abdomen CT, denied by her insurance. Today we will screen for H. pylori infection, if negative, will recommend GI evaluation.

## 2023-05-24 NOTE — Assessment & Plan Note (Signed)
We discussed the importance of regular physical activity and healthy diet for prevention of chronic illness and/or complications. Preventive guidelines reviewed. Vaccination: Shingrix given today, declined flu vaccination. Ca++ and vit D supplementation recommended. Next CPE in a year.

## 2023-05-25 LAB — URINALYSIS W MICROSCOPIC + REFLEX CULTURE
Bacteria, UA: NONE SEEN /[HPF]
Bilirubin Urine: NEGATIVE
Glucose, UA: NEGATIVE
Hgb urine dipstick: NEGATIVE
Hyaline Cast: NONE SEEN /[LPF]
Ketones, ur: NEGATIVE
Leukocyte Esterase: NEGATIVE
Nitrites, Initial: NEGATIVE
Protein, ur: NEGATIVE
RBC / HPF: NONE SEEN /[HPF] (ref 0–2)
Specific Gravity, Urine: 1.008 (ref 1.001–1.035)
Squamous Epithelial / HPF: NONE SEEN /[HPF] (ref <=5)
WBC, UA: NONE SEEN /[HPF] (ref 0–5)
pH: 6.5 (ref 5.0–8.0)

## 2023-05-25 LAB — NO CULTURE INDICATED

## 2023-05-27 LAB — HELICOBACTER PYLORI  SPECIAL ANTIGEN
MICRO NUMBER:: 15548273
SPECIMEN QUALITY: ADEQUATE

## 2023-06-06 ENCOUNTER — Other Ambulatory Visit: Payer: Self-pay | Admitting: Family Medicine

## 2023-06-06 DIAGNOSIS — R1011 Right upper quadrant pain: Secondary | ICD-10-CM

## 2023-06-29 ENCOUNTER — Ambulatory Visit
Admission: RE | Admit: 2023-06-29 | Discharge: 2023-06-29 | Disposition: A | Discharge status: Home / Self Care | Payer: Managed Care, Other (non HMO) | Source: Ambulatory Visit | Attending: Family Medicine | Admitting: Family Medicine

## 2023-06-29 DIAGNOSIS — Z1231 Encounter for screening mammogram for malignant neoplasm of breast: Secondary | ICD-10-CM

## 2023-07-18 ENCOUNTER — Encounter: Payer: Self-pay | Admitting: Gastroenterology

## 2023-07-18 ENCOUNTER — Ambulatory Visit: Payer: Managed Care, Other (non HMO) | Admitting: Gastroenterology

## 2023-07-18 VITALS — BP 122/88 | HR 76 | Ht 60.0 in | Wt 127.0 lb

## 2023-07-18 DIAGNOSIS — R1011 Right upper quadrant pain: Secondary | ICD-10-CM | POA: Diagnosis not present

## 2023-07-18 NOTE — Progress Notes (Signed)
Chief Complaint: RUQ pain Primary GI MD: Dr. Tomasa Rand  HPI: 52 year old female with medical history as listed below presents for evaluation of RUQ pain  RUQ ultrasound 2022 done for RUQ pain shows scattered hepatic cysts stable from prior exam with largest measuring 1.6 cm in the right lobe.  Fatty infiltration of the liver.  Patent portal vein.  Gallbladder without gallstones or wall thickening.  Recent labs 05/24/2023 normal CBC/CMP.  No elevation in liver enzymes.  H. pylori stool antigen negative  Discussed the use of AI scribe software for clinical note transcription with the patient, who gave verbal consent to proceed.  History of Present Illness   The patient presents with a chronic, intermittent, dull pain in the right upper quadrant, specifically below the last rib. The pain, which started over three years ago, is described as tolerable.   An ultrasound performed in 2022 revealed mild fatty liver but did not provide an explanation for the pain. The patient reports that the pain sometimes seems to worsen after eating, but it is not associated with movement. It comes and goes and can be gone for a few days without issue and then return and last for minutes to hours. patient has not undergone any further diagnostic imaging due to insurance denial for a CT scan. The patient denies any other new symptoms or changes in health.      PREVIOUS GI WORKUP   Colonoscopy 08/2021 - Normal colon with no specimens collected - Repeat 10 years  EGD 2017 done with [facility] for LUQ pain - Gastritis - Normal examined duodenum - No biopsy results available  Past Medical History:  Diagnosis Date   Anemia    Constipation    GERD (gastroesophageal reflux disease)    Migraine headache     Past Surgical History:  Procedure Laterality Date   TUBAL LIGATION      No current outpatient medications on file.   No current facility-administered medications for this visit.    Allergies as of  07/18/2023   (No Known Allergies)    Family History  Problem Relation Age of Onset   Hypertension Mother    Kidney disease Father    Cancer Neg Hx    Colon cancer Neg Hx    Colon polyps Neg Hx    Esophageal cancer Neg Hx    Rectal cancer Neg Hx    Stomach cancer Neg Hx     Social History   Socioeconomic History   Marital status: Married    Spouse name: Not on file   Number of children: Not on file   Years of education: Not on file   Highest education level: Not on file  Occupational History   Not on file  Tobacco Use   Smoking status: Never   Smokeless tobacco: Never  Substance and Sexual Activity   Alcohol use: No   Drug use: No   Sexual activity: Yes    Birth control/protection: Surgical  Other Topics Concern   Not on file  Social History Narrative   Marital Status:  Married    Children:  G1 P1001 (Son - Manufacturing systems engineer)    Pets:  None    Living Situation: Lives with husband.   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[employer])       Education:  Manufacturing engineer (Information Systems):  [employer]    Tobacco Use/Exposure:  None    Alcohol Use:  None    Drug Use:  None   Diet:  Regular  Exercise:  Limited    Hobbies:  Reading          Social Determinants of Health   Financial Resource Strain: Not on file  Food Insecurity: Not on file  Transportation Needs: Not on file  Physical Activity: Not on file  Stress: Not on file  Social Connections: Not on file  Intimate Partner Violence: Not on file    Review of Systems:    Constitutional: No weight loss, fever, chills, weakness or fatigue HEENT: Eyes: No change in vision               Ears, Nose, Throat:  No change in hearing or congestion Skin: No rash or itching Cardiovascular: No chest pain, chest pressure or palpitations   Respiratory: No SOB or cough Gastrointestinal: See HPI and otherwise negative Genitourinary: No dysuria or change in urinary frequency Neurological: No headache, dizziness or  syncope Musculoskeletal: No new muscle or joint pain Hematologic: No bleeding or bruising Psychiatric: No history of depression or anxiety    Physical Exam:  Vital signs: BP 122/88   Pulse 76   Ht 5' (1.524 m)   Wt 127 lb (57.6 kg)   BMI 24.80 kg/m   Constitutional: NAD, Well developed, Well nourished, alert and cooperative Head:  Normocephalic and atraumatic. Eyes:   PEERL, EOMI. No icterus. Conjunctiva pink. Respiratory: Respirations even and unlabored. Lungs clear to auscultation bilaterally.   No wheezes, crackles, or rhonchi.  Cardiovascular:  Regular rate and rhythm. No peripheral edema, cyanosis or pallor.  Gastrointestinal:  Soft, nondistended, nontender. No rebound or guarding. Normal bowel sounds. No appreciable masses or hepatomegaly. Rectal:  Not performed.  Msk:  Symmetrical without gross deformities. Without edema, no deformity or joint abnormality.  Neurologic:  Alert and  oriented x4;  grossly normal neurologically.  Skin:   Dry and intact without significant lesions or rashes. Psychiatric: Oriented to person, place and time. Demonstrates good judgement and reason without abnormal affect or behaviors.  RELEVANT LABS AND IMAGING: CBC    Component Value Date/Time   WBC 3.3 (L) 05/24/2023 0815   RBC 4.36 05/24/2023 0815   HGB 13.4 05/24/2023 0815   HCT 41.1 05/24/2023 0815   PLT 182.0 05/24/2023 0815   MCV 94.4 05/24/2023 0815   MCH 30.9 07/27/2015 1728   MCHC 32.7 05/24/2023 0815   RDW 12.3 05/24/2023 0815    CMP     Component Value Date/Time   NA 140 05/24/2023 0815   NA 139 10/08/2015 0000   K 3.7 05/24/2023 0815   CL 105 05/24/2023 0815   CO2 26 05/24/2023 0815   GLUCOSE 86 05/24/2023 0815   BUN 11 05/24/2023 0815   BUN 17 10/08/2015 0000   CREATININE 0.59 05/24/2023 0815   CALCIUM 9.4 05/24/2023 0815   PROT 6.8 05/24/2023 0815   ALBUMIN 4.2 05/24/2023 0815   AST 18 05/24/2023 0815   ALT 12 05/24/2023 0815   ALKPHOS 67 05/24/2023 0815    BILITOT 0.6 05/24/2023 0815   GFRNONAA >60 07/27/2015 1728   GFRAA >60 07/27/2015 1728     Assessment/Plan:      Right Upper Quadrant Pain Chronic, intermittent, dull pain in the right upper quadrant, below the last rib. Pain occasionally sharp and severe. No clear correlation with eating. Previous ultrasound in 2022 showed mild fatty liver but no gallstones. Pain location and characteristics suggestive of possible biliary dyskinesia. --Order repeat abdominal ultrasound to evaluate liver, bile ducts, and gallbladder. Patient is very concerned about her liver. --  If ultrasound is normal or does not show gallstones, proceed with HIDA scan to assess for biliary dyskinesia. -- if the above are negative, may need to consider repeat EGD --Advise patient to monitor for correlation of pain with fatty or greasy foods.      Lara Mulch [facility] 07/18/2023, 1:58 PM  Cc: [place], Betty G, MD

## 2023-07-18 NOTE — Patient Instructions (Signed)
You have been scheduled for an abdominal ultrasound at [facility] (1st floor of hospital) on Friday 07/22/23 at 9 am. Please arrive 30 minutes prior to your appointment for registration. Make certain not to have anything to eat or drink 6 hours prior to your appointment. Should you need to reschedule your appointment, please contact radiology at [phone]. This test typically takes about 30 minutes to perform.  _______________________________________________________  If your blood pressure at your visit was 140/90 or greater, please contact your primary care physician to follow up on this.  _______________________________________________________  If you are age 51 or older, your body mass index should be between 23-30. Your Body mass index is 24.8 kg/m. If this is out of the aforementioned range listed, please consider follow up with your Primary Care Provider.  If you are age 19 or younger, your body mass index should be between 19-25. Your Body mass index is 24.8 kg/m. If this is out of the aformentioned range listed, please consider follow up with your Primary Care Provider.   ________________________________________________________  The [facility] providers would like to encourage you to use [facility] to communicate with providers for non-urgent requests or questions.  Due to long hold times on the telephone, sending your provider a message by [facility] may be a faster and more efficient way to get a response.  Please allow 48 business hours for a response.  Please remember that this is for non-urgent requests.  _______________________________________________________

## 2023-07-19 NOTE — Progress Notes (Signed)
Agree with the assessment and plan as outlined by Boone Master, PA-C.

## 2023-07-22 ENCOUNTER — Ambulatory Visit (HOSPITAL_COMMUNITY): Payer: Managed Care, Other (non HMO)

## 2023-08-22 ENCOUNTER — Encounter: Payer: Self-pay | Admitting: Family

## 2023-08-22 ENCOUNTER — Ambulatory Visit: Payer: Managed Care, Other (non HMO) | Admitting: Internal Medicine

## 2023-08-22 ENCOUNTER — Ambulatory Visit: Payer: Managed Care, Other (non HMO) | Admitting: Family

## 2023-08-22 VITALS — BP 137/89 | HR 66 | Temp 98.0°F | Ht 60.0 in | Wt 123.2 lb

## 2023-08-22 DIAGNOSIS — J020 Streptococcal pharyngitis: Secondary | ICD-10-CM

## 2023-08-22 LAB — POCT RAPID STREP A (OFFICE): Rapid Strep A Screen: POSITIVE — AB

## 2023-08-22 MED ORDER — AMOXICILLIN 500 MG PO CAPS
500.0000 mg | ORAL_CAPSULE | Freq: Two times a day (BID) | ORAL | 0 refills | Status: AC
Start: 2023-08-22 — End: 2023-09-01

## 2023-08-22 NOTE — Progress Notes (Signed)
Patient ID: Katelyn Jenkins, female    DOB: 11/04/1970, 52 y.o.   MRN: [id number]  Chief Complaint  Patient presents with   Sinus Problem    Pt c/o left ear pain since last night, left sided throat pain since this morning.        Discussed the use of AI scribe software for clinical note transcription with the patient, who gave verbal consent to proceed.  History of Present Illness   The patient presents with left-sided ear pain and a sore throat that began either last night or this morning. She denies recent exposure to sick individuals but did recently return from a long trip to [place]. She has a history of strep throat as an adult, with the most recent episode occurring last year. The ear pain, which initially presented as a sharp sensation, has since evolved into a dull, congested or full feeling. The patient denies recent sinus symptoms like a runny nose. She also reports a mildly elevated blood pressure reading during today's visit, which she attributes to possibly feeling unwell.     Assessment & Plan:     Strep Throat - Positive rapid strep test. Left-sided ear pain and throat pain. No lymphadenopathy or erythema. No known recent sick contacts but recent travel. -Start Amoxicillin 500mg  twice daily for 10 days, advised on use & SE. -Take OTC Ibuprofen 600mg  tid prn for throat pain or any fever. -Can try warm salt water gargles prn. -Drink at least 2L of water qd. -Call office if sx are not improved after finishing AMOX.     Subjective:    No outpatient medications prior to visit.   No facility-administered medications prior to visit.   Past Medical History:  Diagnosis Date   Anemia    Constipation    GERD (gastroesophageal reflux disease)    Migraine headache    Past Surgical History:  Procedure Laterality Date   TUBAL LIGATION     No Known Allergies    Objective:    Physical Exam Vitals and nursing note reviewed.  Constitutional:      Appearance: Normal  appearance.  HENT:     Head: Normocephalic.     Right Ear: Tympanic membrane normal.     Left Ear: Tympanic membrane normal.     Nose: Nose normal.     Mouth/Throat:     Mouth: Mucous membranes are moist.     Pharynx: No pharyngeal swelling, oropharyngeal exudate, posterior oropharyngeal erythema, uvula swelling or postnasal drip.     Tonsils: No tonsillar exudate or tonsillar abscesses.  Eyes:     Pupils: Pupils are equal, round, and reactive to light.  Cardiovascular:     Rate and Rhythm: Normal rate and regular rhythm.  Pulmonary:     Effort: Pulmonary effort is normal.     Breath sounds: Normal breath sounds.  Musculoskeletal:        General: Normal range of motion.     Cervical back: Normal range of motion.  Lymphadenopathy:     Cervical: No cervical adenopathy.  Skin:    General: Skin is warm and dry.  Neurological:     Mental Status: She is alert.  Psychiatric:        Mood and Affect: Mood normal.        Behavior: Behavior normal.    BP 137/89 (BP Location: Left Arm, Patient Position: Sitting, Cuff Size: Normal)   Pulse 66   Temp 98 F (36.7 C) (Temporal)   Ht 5' (1.524  m)   Wt 123 lb 4 oz (55.9 kg)   SpO2 98%   BMI 24.07 kg/m  Wt Readings from Last 3 Encounters:  08/22/23 123 lb 4 oz (55.9 kg)  07/18/23 127 lb (57.6 kg)  05/24/23 122 lb (55.3 kg)      Dulce Sellar, NP

## 2023-09-19 ENCOUNTER — Ambulatory Visit (HOSPITAL_COMMUNITY)
Admission: RE | Admit: 2023-09-19 | Discharge: 2023-09-19 | Disposition: A | Discharge status: Home / Self Care | Payer: Managed Care, Other (non HMO) | Source: Ambulatory Visit | Attending: Gastroenterology | Admitting: Gastroenterology

## 2023-09-19 DIAGNOSIS — R1011 Right upper quadrant pain: Secondary | ICD-10-CM | POA: Diagnosis present

## 2024-04-12 ENCOUNTER — Emergency Department (HOSPITAL_BASED_OUTPATIENT_CLINIC_OR_DEPARTMENT_OTHER): Admission: EM | Admit: 2024-04-12 | Discharge: 2024-04-12 | Disposition: A | Discharge status: Home / Self Care

## 2024-04-12 ENCOUNTER — Other Ambulatory Visit: Payer: Self-pay

## 2024-04-12 ENCOUNTER — Ambulatory Visit: Payer: Self-pay

## 2024-04-12 ENCOUNTER — Encounter (HOSPITAL_BASED_OUTPATIENT_CLINIC_OR_DEPARTMENT_OTHER): Payer: Self-pay

## 2024-04-12 DIAGNOSIS — G510 Bell's palsy: Secondary | ICD-10-CM | POA: Diagnosis not present

## 2024-04-12 DIAGNOSIS — R29810 Facial weakness: Secondary | ICD-10-CM | POA: Diagnosis present

## 2024-04-12 MED ORDER — PREDNISONE 10 MG PO TABS: ORAL_TABLET | ORAL | 0 refills | Status: AC

## 2024-04-12 NOTE — ED Provider Notes (Signed)
 [facility] Provider Note   CSN: [id number] Arrival date & time: 04/12/24  1419     Patient presents with: Numbness   Katelyn Jenkins is a 53 y.o. female.   53 year old female presents for evaluation of facial numbness and drooping.  She states this started yesterday.  Denies any tick bites or recent viral illnesses.  She is having some difficulty closing her eye as well as numbness in her forehead and difficulty raising her eyebrows.  Denies any other symptoms or concerns at this time.  Not having any other numbness or weakness or difficulty with ambulation.        Prior to Admission medications   Medication Sig Start Date End Date Taking? Authorizing Provider  predniSONE  (DELTASONE ) 10 MG tablet Take 6 tablets (60 mg total) by mouth daily with breakfast for 5 days, THEN 5 tablets (50 mg total) daily with breakfast for 1 day, THEN 4 tablets (40 mg total) daily with breakfast for 1 day, THEN 3 tablets (30 mg total) daily with breakfast for 1 day, THEN 2 tablets (20 mg total) daily with breakfast for 1 day, THEN 1 tablet (10 mg total) daily with breakfast for 1 day, THEN 0.5 tablets (5 mg total) daily with breakfast for 1 day. 04/12/24 04/23/24 Yes Jarmarcus Wambold L, DO    Allergies: Patient has no known allergies.    Review of Systems  Constitutional:  Negative for chills and fever.  HENT:  Negative for ear pain and sore throat.   Eyes:  Negative for pain and visual disturbance.  Respiratory:  Negative for cough and shortness of breath.   Cardiovascular:  Negative for chest pain and palpitations.  Gastrointestinal:  Negative for abdominal pain and vomiting.  Genitourinary:  Negative for dysuria and hematuria.  Musculoskeletal:  Negative for arthralgias and back pain.  Skin:  Negative for color change and rash.  Neurological:  Positive for facial asymmetry and numbness. Negative for seizures and syncope.  All other systems reviewed and are  negative.   Updated Vital Signs BP (!) 157/84 (BP Location: Left Arm)   Pulse 78   Temp 97.9 F (36.6 C) (Oral)   Resp 16   SpO2 99%   Physical Exam Vitals and nursing note reviewed.  Constitutional:      General: She is not in acute distress.    Appearance: Normal appearance. She is well-developed. She is not ill-appearing.  HENT:     Head: Normocephalic and atraumatic.  Eyes:     Conjunctiva/sclera: Conjunctivae normal.  Cardiovascular:     Rate and Rhythm: Normal rate and regular rhythm.     Heart sounds: No murmur heard. Pulmonary:     Effort: Pulmonary effort is normal. No respiratory distress.     Breath sounds: Normal breath sounds.  Abdominal:     Palpations: Abdomen is soft.     Tenderness: There is no abdominal tenderness.  Musculoskeletal:        General: No swelling.     Cervical back: Neck supple.  Skin:    General: Skin is warm and dry.     Capillary Refill: Capillary refill takes less than 2 seconds.  Neurological:     Mental Status: She is alert.     Comments: Patient with decree sensation on right forehead cheek and chin as well as difficulty moving eyebrow and forehead and unable to close eye all the way consistent with Bell's palsy.  Psychiatric:  Mood and Affect: Mood normal.     (all labs ordered are listed, but only abnormal results are displayed) Labs Reviewed - No data to display  EKG: None  Radiology: No results found.   Procedures   Medications Ordered in the ED - No data to display                                  Medical Decision Making Patient here for Bell's palsy.  Has numbness in the forehead and difficulty closing the eye.  No other symptoms of stroke and no other concerns for stroke.  Will start her on steroids.  Will give her first dose here.  Advise close up with primary care and otherwise return to the ER for new or worsening symptoms.  Feels comfortable and discharged home.  Problems Addressed: Bell's palsy:  acute illness or injury  Risk OTC drugs. Prescription drug management.     Final diagnoses:  Bell's palsy    ED Discharge Orders          Ordered    predniSONE  (DELTASONE ) 10 MG tablet  Q breakfast        04/12/24 1444               Floella Ensz L, DO 04/12/24 1455

## 2024-04-12 NOTE — ED Notes (Signed)
 Discharge instructions reviewed with patient. Patient verbalizes understanding, no further questions at this time. Medications/prescriptions and follow up information provided. No acute distress noted at time of departure.   Patient provided with eye patch.

## 2024-04-12 NOTE — Telephone Encounter (Signed)
 FYI Only or Action Required?: FYI only for provider.  Patient was last seen in primary care on 08/22/2023 by Lucius Krabbe, NP.  Called Nurse Triage reporting Neurologic Problem.  Symptoms began yesterday.  Interventions attempted: Nothing.  Symptoms are: gradually worsening.  Triage Disposition: Call EMS 911 Now  Patient/caregiver understands and will follow disposition?: No, refuses disposition    Copied from CRM #[id number]. Topic: Clinical - Red Word Triage >> Apr 12, 2024  8:22 AM [place] MATSU wrote: Red Word that prompted transfer to Nurse Triage: unable to close right eye, right side of face numb, slight slurred words. Reason for Disposition  [1] Weakness (i.e., paralysis, loss of muscle strength) of the face, arm / hand, or leg / foot on one side of the body AND [2] sudden onset AND [3] present now  (Exception: Bell's palsy suspected: weakness on one side of the face developing over hours to days, with no other symptoms.)  Answer Assessment - Initial Assessment Questions 1. SYMPTOM: What is the main symptom you are concerned about? (e.g., weakness, numbness)     numbness 2. ONSET: When did this start? (e.g., minutes, hours, days; while sleeping)     Yesterday noticed it, but had a headache a few days before 3. LAST NORMAL: When was the last time you (the patient) were normal (no symptoms)?     *No Answer* 4. PATTERN Does this come and go, or has it been constant since it started?  Is it present now?     yes 5. CARDIAC SYMPTOMS: Have you had any of the following symptoms: chest pain, difficulty breathing, palpitations?     no 6. NEUROLOGIC SYMPTOMS: Have you had any of the following symptoms: headache, dizziness, vision loss, double vision, changes in speech, unsteady on your feet?    Vision uncomfortable 7. OTHER SYMPTOMS: Do you have any other symptoms?     no  Protocols used: Neurologic Deficit-A-AH

## 2024-04-12 NOTE — Discharge Instructions (Addendum)
 Take your steroids as prescribed.  Pick up artificial tears over-the-counter and use them at nighttime or as needed.  Return to the ER for any new or worsening symptoms otherwise follow-up with your primary care doctor in 1 to 2 weeks.

## 2024-04-12 NOTE — ED Triage Notes (Signed)
 Pt reports having left sided facial numbness since yesterday. Also reports no being able to fully close left eye.Denies any other symptoms.

## 2024-04-12 NOTE — Telephone Encounter (Signed)
 NT offered to call EMS pt refused and states she can get herself to the ED. NT advised against pt driving herself she her symptoms are severe and could get worse and result in an accident, pt refused and insist on driving herself to the ED. NT again tried to explain the severity of the situation and ask that the pt have a neighbor or someone take her. Pt states that she doesn't think its that serious and she will drive herself.

## 2024-04-13 NOTE — Telephone Encounter (Signed)
Pt was seen in the ED.  

## 2024-04-16 ENCOUNTER — Telehealth: Payer: Self-pay

## 2024-04-16 NOTE — Transitions of Care (Post Inpatient/ED Visit) (Signed)
   04/16/2024  Name: Katelyn Jenkins MRN: [id number] DOB: 1971/08/01  Today's TOC FU Call Status:    Attempted to reach the patient regarding the most recent Inpatient/ED visit.  Follow Up Plan: Additional outreach attempts will be made to reach the patient to complete the Transitions of Care (Post Inpatient/ED visit) call.   Signature: Armoni Kludt, CMA

## 2024-04-17 ENCOUNTER — Telehealth: Payer: Self-pay

## 2024-04-17 NOTE — Telephone Encounter (Signed)
 Spoke to pt. Pt decline a hospital f/u with pcp at this time. Pt will see pcp on 10/3 for cpe.

## 2024-04-17 NOTE — Transitions of Care (Post Inpatient/ED Visit) (Signed)
   04/17/2024  Name: Katelyn Jenkins MRN: [id number] DOB: 1971/08/04  Today's TOC FU Call Status: Today's TOC FU Call Status:: Successful TOC FU Call Completed TOC FU Call Complete Date: 04/17/24 Patient's Name and Date of Birth confirmed.  Transition Care Management Follow-up Telephone Call Date of Discharge: 04/12/24 Discharge Facility: [facility] Type of Discharge: Emergency Department How have you been since you were released from the hospital?: Better Any questions or concerns?: No  Items Reviewed: Did you receive and understand the discharge instructions provided?: Yes Medications obtained,verified, and reconciled?: No  Medications Reviewed Today: Medications Reviewed Today   Medications were not reviewed in this encounter     Home Care and Equipment/Supplies:    Functional Questionnaire:    Follow up appointments reviewed: PCP Follow-up appointment confirmed?: Yes (pt declined hospital f/u appt but will see pcp for cpe in 05/25/24.) Date of PCP follow-up appointment?: 05/25/24 Follow-up Provider: Dr. [place]    SIGNATURE: Willeen Craver, CMA

## 2024-04-17 NOTE — Telephone Encounter (Unsigned)
 Copied from CRM [id number]. Topic: General - Other >> Apr 17, 2024  2:52 PM Jasmin G wrote: Reason for CRM: Pt called regarding recent missed phone call from Ms. Moyun, Karpuih, CMA, please call pt back at [phone].

## 2024-05-24 NOTE — Progress Notes (Signed)
 Chief Complaint  Patient presents with   Annual Exam   HPI: Katelyn Jenkins is a 53 y.o. female with a PMHx significant for vitamin D  deficiency, B12 deficiency, IBS, GERD, and migraines, who is here today for her routine physical.  Last CPE: 05/24/23. Since her last visit she was evaluated in the ED on 04/12/24 for Bell's palsy. She has recovered, she did complete treatment with Prednisone  and antiviral medication + acupuncture.  Exercise: She has been stretching and doing zumba most days.  Diet: She cooks at home and trying to eat more vegetables. Sleeps 5-6 hours per night. She doesn't drink alcohol.  No hx of tobacco use. She sees her eye care provided and dentist regularly.  Immunization History  Administered Date(s) Administered   Zoster Recombinant(Shingrix ) 05/05/2022, 05/24/2023   Health Maintenance  Topic Date Due   HIV Screening  Never done   DTaP/Tdap/Td (1 - Tdap) Never done   COVID-19 Vaccine (1) 06/10/2024 (Originally 04/15/1976)   Influenza Vaccine  11/20/2024 (Originally 03/23/2024)   Pneumococcal Vaccine: 50+ Years (1 of 1 - PCV) 05/25/2025 (Originally 04/15/2022)   Hepatitis B Vaccines 19-59 Average Risk (1 of 3 - 19+ 3-dose series) 05/25/2025 (Originally 04/16/1991)   Mammogram  06/28/2025   Cervical Cancer Screening (HPV/Pap Cotest)  05/06/2027   Colonoscopy  09/11/2031   Hepatitis C Screening  Completed   Zoster Vaccines- Shingrix   Completed   HPV VACCINES  Aged Out   Meningococcal B Vaccine  Aged Out   05/05/2022  Cytology - PAP ([facility])  HIGH RISK HPV (Andrew): Negative ADEQUACY: Satisfactory for evaluation; transformation zone component PRESENT. DIAGNOSIS: - Negative for Intraepithelial Lesions or Malignancy (NILM) DIAGNOSIS: - Benign reactive/reparative changes COMMENT (MOLECULAR): Normal Reference Range HPV - Negative    Lab Results  Component Value Date   ALT 12 05/24/2023   AST 18 05/24/2023   ALKPHOS 67 05/24/2023   BILITOT 0.6  05/24/2023   Vitamin D  deficiency: She has been more consistent with taking vitamin D  supplementation since 03/2024.  Taking vitamin D  5000 units daily  Lab Results  Component Value Date   VD25OH 28.73 (L) 05/05/2022   Hyperlipidemia: Currently she is on nonpharmacologic treatment. Lab Results  Component Value Date   CHOL 184 05/24/2023   HDL 53.00 05/24/2023   LDLCALC 73 05/24/2023   LDLDIRECT 117.0 02/11/2021   TRIG 287.0 (H) 05/24/2023   CHOLHDL 3 05/24/2023   She has been more consistent lately with taking vitamin B12 supplementation, she is not sure about those. Lab Results  Component Value Date   VITAMINB12 464 02/11/2021   Review of Systems  Constitutional:  Negative for activity change, appetite change and fever.  HENT:  Negative for mouth sores, sore throat and trouble swallowing.   Eyes:  Negative for redness and visual disturbance.  Respiratory:  Negative for cough, shortness of breath and wheezing.   Cardiovascular:  Negative for chest pain and leg swelling.  Gastrointestinal:  Negative for abdominal pain, nausea and vomiting.  Endocrine: Negative for cold intolerance, heat intolerance, polydipsia, polyphagia and polyuria.  Genitourinary:  Negative for decreased urine volume, dysuria, hematuria, vaginal bleeding and vaginal discharge.  Musculoskeletal:  Negative for gait problem and myalgias.  Skin:  Negative for color change and rash.  Allergic/Immunologic: Negative for environmental allergies.  Neurological:  Negative for syncope, weakness and headaches.  Hematological:  Negative for adenopathy. Does not bruise/bleed easily.  Psychiatric/Behavioral:  Negative for confusion. The patient is not nervous/anxious.   All other systems reviewed  and are negative.  No current outpatient medications on file prior to visit.   No current facility-administered medications on file prior to visit.   Past Medical History:  Diagnosis Date   Anemia    Constipation    GERD  (gastroesophageal reflux disease)    Migraine headache    Past Surgical History:  Procedure Laterality Date   TUBAL LIGATION     No Known Allergies  Family History  Problem Relation Age of Onset   Hypertension Mother    Kidney disease Father    Cancer Neg Hx    Colon cancer Neg Hx    Colon polyps Neg Hx    Esophageal cancer Neg Hx    Rectal cancer Neg Hx    Stomach cancer Neg Hx    Social History   Socioeconomic History   Marital status: Married    Spouse name: Not on file   Number of children: Not on file   Years of education: Not on file   Highest education level: Not on file  Occupational History   Not on file  Tobacco Use   Smoking status: Never   Smokeless tobacco: Never  Substance and Sexual Activity   Alcohol use: No   Drug use: No   Sexual activity: Yes    Birth control/protection: Surgical  Other Topics Concern   Not on file  Social History Narrative   Marital Status:  Married    Children:  G1 P1001 (Son - Manufacturing systems engineer)    Pets:  None    Living Situation: Lives with husband.   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  Wt Readings from Last 3 Encounters:  05/25/24 117 lb 9.6 oz (53.3 kg)  08/22/23 123 lb 4 oz (55.9 kg)  07/18/23 127 lb (57.6 kg)   Physical Exam Vitals and nursing note reviewed.  Constitutional:      General: She is not in acute distress.    Appearance: She is  well-developed.  HENT:     Head: Normocephalic and atraumatic.     Right Ear: Tympanic membrane, ear canal and external ear normal.     Left Ear: Tympanic membrane, ear canal and external ear normal.     Mouth/Throat:     Mouth: Mucous membranes are moist.     Pharynx: Oropharynx is clear. Uvula midline.  Eyes:     Extraocular Movements: Extraocular movements intact.     Conjunctiva/sclera: Conjunctivae normal.     Pupils: Pupils are equal, round, and reactive to light.  Neck:     Thyroid : No thyroid  mass or thyromegaly.  Cardiovascular:     Rate and Rhythm: Normal rate and regular rhythm.     Pulses:  Dorsalis pedis pulses are 2+ on the right side and 2+ on the left side.     Heart sounds: No murmur heard. Pulmonary:     Effort: Pulmonary effort is normal. No respiratory distress.     Breath sounds: Normal breath sounds.  Abdominal:     Palpations: Abdomen is soft. There is no hepatomegaly or mass.     Tenderness: There is no abdominal tenderness.  Genitourinary:    Comments: No concerns. Musculoskeletal:     Right lower leg: No edema.     Left lower leg: No edema.     Comments: No major deformity or signs of synovitis appreciated.  Lymphadenopathy:     Cervical: No cervical adenopathy.     Upper Body:     Right upper body: No supraclavicular adenopathy.     Left upper body: No supraclavicular adenopathy.  Skin:    General: Skin is warm.     Findings: No erythema or rash.  Neurological:     General: No focal deficit present.     Mental Status: She is alert and oriented to person, place, and time.     Cranial Nerves: No cranial nerve deficit.     Coordination: Coordination normal.     Gait: Gait normal.     Deep Tendon Reflexes:     Reflex Scores:      Bicep reflexes are 2+ on the right side and 2+ on the left side.      Patellar reflexes are 2+ on the right side and 2+ on the left side. Psychiatric:        Mood and Affect: Mood and affect normal.    ASSESSMENT AND PLAN:  Katelyn Jenkins was here today for her annual physical examination.  Orders Placed This Encounter  Procedures   Comprehensive metabolic panel with GFR   Lipid panel   VITAMIN D  25 Hydroxy (Vit-D Deficiency, Fractures)   Vitamin B12   Lab Results  Component Value Date   NA 140 05/25/2024   CL 104 05/25/2024   K 3.9 05/25/2024   CO2 28 05/25/2024   BUN 15 05/25/2024   CREATININE 0.65 05/25/2024   GFR 101.45 05/25/2024   CALCIUM 9.8 05/25/2024   ALBUMIN 4.5 05/25/2024   GLUCOSE 89 05/25/2024   Lab Results  Component Value Date   ALT 14 05/25/2024   AST 23 05/25/2024   ALKPHOS 68 05/25/2024   BILITOT 0.5 05/25/2024   Lab Results  Component Value Date   CHOL 227 (H) 05/25/2024   HDL 57.00 05/25/2024   LDLCALC 134 (H) 05/25/2024   LDLDIRECT 117.0 02/11/2021   TRIG 181.0 (H) 05/25/2024   CHOLHDL 4 05/25/2024   Lab Results  Component Value Date   VITAMINB12 935 (H) 05/25/2024   Lab Results  Component Value Date   VD25OH 46.64 05/25/2024  The 10-year ASCVD risk score (Arnett DK, et al., 2019) is: 1.7%   Values used to calculate the score:     Age: 24 years     Clincally relevant sex: Female     Is Non-Hispanic African American: No     Diabetic: No     Tobacco smoker: No     Systolic Blood Pressure: 130 mmHg     Is BP treated: No     HDL Cholesterol: 57 mg/dL     Total Cholesterol: 227 mg/dL  Routine general medical examination at a health care facility Assessment & Plan: We discussed the importance of regular physical activity and healthy  diet for prevention of chronic illness and/or complications. Preventive guidelines reviewed. Vaccination: Declined Prevnar 20 and flu vaccine Cervical cancer screening due in 2028. Mammogram due in 07/2024. Colonoscopy done in 08/2021. Next CPE in a year.   Hyperlipidemia, unspecified hyperlipidemia type Assessment & Plan: Elevated triglycerides. Continue nonpharmacologic treatment. Further  recommendation will be given according to lipid panel result.  Orders: -     Comprehensive metabolic panel with GFR; Future -     Lipid panel; Future  B12 deficiency Assessment & Plan: Continue current dose of B12 supplementation. Further recommendation will be given according to B12 result.  Orders: -     Vitamin B12; Future  Vitamin D  deficiency, unspecified Assessment & Plan: Continue vitamin D  5000 units daily. Further recommendation will be given according to 25 OH vitamin D  result.  Orders: -     VITAMIN D  25 Hydroxy (Vit-D Deficiency, Fractures); Future  Screening for endocrine, metabolic and immunity disorder -     Comprehensive metabolic panel with GFR; Future   Return in 1 year (on 05/25/2025) for CPE, chronic problems.  Maryclare Nydam G. [place], MD  [facility]. [place] office.

## 2024-05-25 ENCOUNTER — Ambulatory Visit: Admitting: Family Medicine

## 2024-05-25 ENCOUNTER — Encounter: Payer: Self-pay | Admitting: Family Medicine

## 2024-05-25 VITALS — BP 130/80 | HR 76 | Temp 97.9°F | Resp 16 | Ht 60.0 in | Wt 117.6 lb

## 2024-05-25 DIAGNOSIS — Z0000 Encounter for general adult medical examination without abnormal findings: Secondary | ICD-10-CM

## 2024-05-25 DIAGNOSIS — E538 Deficiency of other specified B group vitamins: Secondary | ICD-10-CM | POA: Diagnosis not present

## 2024-05-25 DIAGNOSIS — Z1329 Encounter for screening for other suspected endocrine disorder: Secondary | ICD-10-CM | POA: Diagnosis not present

## 2024-05-25 DIAGNOSIS — Z130 Encounter for screening for diseases of the blood and blood-forming organs and certain disorders involving the immune mechanism: Secondary | ICD-10-CM | POA: Diagnosis not present

## 2024-05-25 DIAGNOSIS — Z13228 Encounter for screening for other metabolic disorders: Secondary | ICD-10-CM

## 2024-05-25 DIAGNOSIS — E785 Hyperlipidemia, unspecified: Secondary | ICD-10-CM | POA: Diagnosis not present

## 2024-05-25 DIAGNOSIS — E559 Vitamin D deficiency, unspecified: Secondary | ICD-10-CM

## 2024-05-25 LAB — COMPREHENSIVE METABOLIC PANEL WITH GFR
ALT: 14 U/L (ref 0–35)
AST: 23 U/L (ref 0–37)
Albumin: 4.5 g/dL (ref 3.5–5.2)
Alkaline Phosphatase: 68 U/L (ref 39–117)
BUN: 15 mg/dL (ref 6–23)
CO2: 28 meq/L (ref 19–32)
Calcium: 9.8 mg/dL (ref 8.4–10.5)
Chloride: 104 meq/L (ref 96–112)
Creatinine, Ser: 0.65 mg/dL (ref 0.40–1.20)
GFR: 101.45 mL/min (ref >60.00)
Glucose, Bld: 89 mg/dL (ref 70–99)
Potassium: 3.9 meq/L (ref 3.5–5.1)
Sodium: 140 meq/L (ref 135–145)
Total Bilirubin: 0.5 mg/dL (ref 0.2–1.2)
Total Protein: 7.1 g/dL (ref 6.0–8.3)

## 2024-05-25 LAB — LIPID PANEL
Cholesterol: 227 mg/dL — ABNORMAL HIGH (ref 0–200)
HDL: 57 mg/dL (ref >39.00)
LDL Cholesterol: 134 mg/dL — ABNORMAL HIGH (ref 0–99)
NonHDL: 169.72
Total CHOL/HDL Ratio: 4
Triglycerides: 181 mg/dL — ABNORMAL HIGH (ref 0.0–149.0)
VLDL: 36.2 mg/dL (ref 0.0–40.0)

## 2024-05-25 LAB — VITAMIN D 25 HYDROXY (VIT D DEFICIENCY, FRACTURES): VITD: 46.64 ng/mL (ref 30.00–100.00)

## 2024-05-25 LAB — VITAMIN B12: Vitamin B-12: 935 pg/mL — ABNORMAL HIGH (ref 211–911)

## 2024-05-25 NOTE — Assessment & Plan Note (Signed)
 Elevated triglycerides. Continue nonpharmacologic treatment. Further recommendation will be given according to lipid panel result.

## 2024-05-25 NOTE — Patient Instructions (Addendum)
 A few things to remember from today's visit:  Routine general medical examination at a health care facility  Hyperlipidemia, unspecified hyperlipidemia type - Plan: Comprehensive metabolic panel with GFR, Lipid panel  B12 deficiency - Plan: Vitamin B12  Vitamin D  deficiency, unspecified - Plan: VITAMIN D  25 Hydroxy (Vit-D Deficiency, Fractures)  Screening for endocrine, metabolic and immunity disorder - Plan: Comprehensive metabolic panel with GFR  Do not use My Chart to request refills or for acute issues that need immediate attention. If you send a my chart message, it may take a few days to be addressed, specially if I am not in the office.  Please be sure medication list is accurate. If a new problem present, please set up appointment sooner than planned today.  Health Maintenance, Female Adopting a healthy lifestyle and getting preventive care are important in promoting health and wellness. Ask your health care provider about: The right schedule for you to have regular tests and exams. Things you can do on your own to prevent diseases and keep yourself healthy. What should I know about diet, weight, and exercise? Eat a healthy diet  Eat a diet that includes plenty of vegetables, fruits, low-fat dairy products, and lean protein. Do not eat a lot of foods that are high in solid fats, added sugars, or sodium. Maintain a healthy weight Body mass index (BMI) is used to identify weight problems. It estimates body fat based on height and weight. Your health care provider can help determine your BMI and help you achieve or maintain a healthy weight. Get regular exercise Get regular exercise. This is one of the most important things you can do for your health. Most adults should: Exercise for at least 150 minutes each week. The exercise should increase your heart rate and make you sweat (moderate-intensity exercise). Do strengthening exercises at least twice a week. This is in addition to  the moderate-intensity exercise. Spend less time sitting. Even light physical activity can be beneficial. Watch cholesterol and blood lipids Have your blood tested for lipids and cholesterol at 52 years of age, then have this test every 5 years. Have your cholesterol levels checked more often if: Your lipid or cholesterol levels are high. You are older than 52 years of age. You are at high risk for heart disease. What should I know about cancer screening? Depending on your health history and family history, you may need to have cancer screening at various ages. This may include screening for: Breast cancer. Cervical cancer. Colorectal cancer. Skin cancer. Lung cancer. What should I know about heart disease, diabetes, and high blood pressure? Blood pressure and heart disease High blood pressure causes heart disease and increases the risk of stroke. This is more likely to develop in people who have high blood pressure readings or are overweight. Have your blood pressure checked: Every 3-5 years if you are 20-56 years of age. Every year if you are 8 years old or older. Diabetes Have regular diabetes screenings. This checks your fasting blood sugar level. Have the screening done: Once every three years after age 5 if you are at a normal weight and have a low risk for diabetes. More often and at a younger age if you are overweight or have a high risk for diabetes. What should I know about preventing infection? Hepatitis B If you have a higher risk for hepatitis B, you should be screened for this virus. Talk with your health care provider to find out if you are at risk  for hepatitis B infection. Hepatitis C Testing is recommended for: Everyone born from [birth year] through 1964. Anyone with known risk factors for hepatitis C. Sexually transmitted infections (STIs) Get screened for STIs, including gonorrhea and chlamydia, if: You are sexually active and are younger than 52 years of age. You  are older than 52 years of age and your health care provider tells you that you are at risk for this type of infection. Your sexual activity has changed since you were last screened, and you are at increased risk for chlamydia or gonorrhea. Ask your health care provider if you are at risk. Ask your health care provider about whether you are at high risk for HIV. Your health care provider may recommend a prescription medicine to help prevent HIV infection. If you choose to take medicine to prevent HIV, you should first get tested for HIV. You should then be tested every 3 months for as long as you are taking the medicine. Pregnancy If you are about to stop having your period (premenopausal) and you may become pregnant, seek counseling before you get pregnant. Take 400 to 800 micrograms (mcg) of folic acid every day if you become pregnant. Ask for birth control (contraception) if you want to prevent pregnancy. Osteoporosis and menopause Osteoporosis is a disease in which the bones lose minerals and strength with aging. This can result in bone fractures. If you are 31 years old or older, or if you are at risk for osteoporosis and fractures, ask your health care provider if you should: Be screened for bone loss. Take a calcium or vitamin D  supplement to lower your risk of fractures. Be given hormone replacement therapy (HRT) to treat symptoms of menopause. Follow these instructions at home: Alcohol use Do not drink alcohol if: Your health care provider tells you not to drink. You are pregnant, may be pregnant, or are planning to become pregnant. If you drink alcohol: Limit how much you have to: 0-1 drink a day. Know how much alcohol is in your drink. In the [place] one drink equals one 12 oz bottle of beer (355 mL), one 5 oz glass of wine (148 mL), or one 1 oz glass of hard liquor (44 mL). Lifestyle Do not use any products that contain nicotine or tobacco. These products include cigarettes, chewing  tobacco, and vaping devices, such as e-cigarettes. If you need help quitting, ask your health care provider. Do not use street drugs. Do not share needles. Ask your health care provider for help if you need support or information about quitting drugs. General instructions Schedule regular health, dental, and eye exams. Stay current with your vaccines. Tell your health care provider if: You often feel depressed. You have ever been abused or do not feel safe at home. Summary Adopting a healthy lifestyle and getting preventive care are important in promoting health and wellness. Follow your health care provider's instructions about healthy diet, exercising, and getting tested or screened for diseases. Follow your health care provider's instructions on monitoring your cholesterol and blood pressure. This information is not intended to replace advice given to you by your health care provider. Make sure you discuss any questions you have with your health care provider. Document Revised: 12/29/2020 Document Reviewed: 12/29/2020 Elsevier Patient Education  2024 [employer].

## 2024-05-25 NOTE — Assessment & Plan Note (Signed)
Continue current dose of B12 supplementation. Further recommendation will be given according to B12 result. 

## 2024-05-25 NOTE — Assessment & Plan Note (Signed)
 We discussed the importance of regular physical activity and healthy diet for prevention of chronic illness and/or complications. Preventive guidelines reviewed. Vaccination: Declined Prevnar 20 and flu vaccine Cervical cancer screening due in 2028. Mammogram due in 07/2024. Colonoscopy done in 08/2021. Next CPE in a year.

## 2024-05-25 NOTE — Assessment & Plan Note (Signed)
Continue vitamin D 5000 units daily. Further recommendation will be given according to 25 OH vitamin D result.

## 2024-05-26 ENCOUNTER — Ambulatory Visit: Payer: Self-pay | Admitting: Family Medicine

## 2024-06-12 ENCOUNTER — Other Ambulatory Visit: Payer: Self-pay | Admitting: Family Medicine

## 2024-06-12 DIAGNOSIS — Z1231 Encounter for screening mammogram for malignant neoplasm of breast: Secondary | ICD-10-CM

## 2024-06-29 ENCOUNTER — Ambulatory Visit
Admission: RE | Admit: 2024-06-29 | Discharge: 2024-06-29 | Disposition: A | Discharge status: Home / Self Care | Source: Ambulatory Visit | Attending: Family Medicine | Admitting: Family Medicine

## 2024-06-29 DIAGNOSIS — Z1231 Encounter for screening mammogram for malignant neoplasm of breast: Secondary | ICD-10-CM
